# Patient Record
Sex: Female | Born: 1935 | Race: White | Hispanic: No | Smoking: Never smoker
Health system: Southern US, Community
[De-identification: ages and names within clinical notes are randomized; demographics above are authoritative.]

## PROBLEM LIST (undated history)

## (undated) DIAGNOSIS — S82899A Other fracture of unspecified lower leg, initial encounter for closed fracture: Secondary | ICD-10-CM

## (undated) DIAGNOSIS — F0390 Unspecified dementia without behavioral disturbance: Secondary | ICD-10-CM

## (undated) DIAGNOSIS — E7800 Pure hypercholesterolemia, unspecified: Secondary | ICD-10-CM

## (undated) DIAGNOSIS — S72009A Fracture of unspecified part of neck of unspecified femur, initial encounter for closed fracture: Secondary | ICD-10-CM

## (undated) DIAGNOSIS — H3530 Unspecified macular degeneration: Secondary | ICD-10-CM

## (undated) DIAGNOSIS — I10 Essential (primary) hypertension: Secondary | ICD-10-CM

---

## 2004-03-23 ENCOUNTER — Inpatient Hospital Stay (HOSPITAL_COMMUNITY): Admission: AD | Admit: 2004-03-23 | Discharge: 2004-03-28 | Payer: Self-pay | Admitting: Orthopedic Surgery

## 2004-03-23 ENCOUNTER — Ambulatory Visit: Payer: Self-pay | Admitting: Physical Medicine & Rehabilitation

## 2004-03-28 ENCOUNTER — Ambulatory Visit: Payer: Self-pay | Admitting: Physical Medicine & Rehabilitation

## 2004-03-28 ENCOUNTER — Inpatient Hospital Stay
Admission: RE | Admit: 2004-03-28 | Discharge: 2004-04-06 | Payer: Self-pay | Admitting: Physical Medicine & Rehabilitation

## 2007-08-20 ENCOUNTER — Emergency Department (HOSPITAL_COMMUNITY): Admission: EM | Admit: 2007-08-20 | Discharge: 2007-08-20 | Payer: Self-pay | Admitting: Emergency Medicine

## 2007-12-29 ENCOUNTER — Emergency Department (HOSPITAL_COMMUNITY): Admission: EM | Admit: 2007-12-29 | Discharge: 2007-12-29 | Payer: Self-pay | Admitting: Emergency Medicine

## 2010-11-16 NOTE — Discharge Summary (Signed)
Alyssa Alyssa, Alyssa NO.:  [id number]   MEDICAL RECORD NO.:  [id number]          PATIENT TYPE:  ORB   LOCATION:  4533                         FACILITY:  [facility]   PHYSICIAN:  Ellwood Dense, M.D.   DATE OF BIRTH:  [birth year]   DATE OF ADMISSION:  03/28/2004  DATE OF DISCHARGE:  04/06/2004                                 DISCHARGE SUMMARY   DISCHARGE DIAGNOSES:  1.  Left distal tibial fracture with open reduction internal fixation.  2.  Hypertension.  3.  Congested cough secondary to postnasal drip.   HISTORY OF PRESENT ILLNESS:  Alyssa Alyssa is a 77 year old female with  history of hypertension who fell sustaining tibia fracture on September 23.  The patient underwent ORIF by Dr. Carola Jacobson and postop is nonweightbearing.  Subcutaneous Lovenox is being used for DVT prophylaxis.  Cam walker is  currently in place.  Therapies were initiated and the patient has been  progressing along well.  Currently, she is minimal assist for transfer,  minimal assist ambulating 80 feet at nonweightbearing status.   PAST MEDICAL HISTORY:  1.  Hypertension.  2.  Decreased vision in left eye.  3.  Urgency.  4.  Chronic congested cough with postnasal drip for the past year.   ALLERGIES:  AMPICILLIN.   SOCIAL HISTORY:  The patient lives alone in one-level home with three to  four steps to entry.  Was independent prior to admission.  She does not use  any tobacco or alcohol.   HOSPITAL COURSE:  Alyssa Alyssa was admitted to subacute on March 28, 2004, for levels of therapy to consist of OT/PT daily.  Past admission,  she was maintained on subcu Lovenox for DVT prophylaxis.  Secondary to the  patient's chronic cough, she was started on some Robitussin as well as  Zyrtec as an antihistamine with Nasacort to help treat postnasal drip.  Followup labs done on September 29, showed hemoglobin 9.6, hematocrit 27.7,  white count 7.7, platelets 306.  Check of electrolytes showed  sodium 138,  potassium 4.1, chloride 105, CO2 28, BUN 7, creatinine 0.8, glucose 88.  Mild elevation in LFTs noted with SGOT 50, SGPT 44, Alk phos 41.  Blood  pressures were monitored on a b.i.d. basis and showed good control.  The  patient continued with congested cough, especially in the a.m.  Robitussin  was changed to [facility], however, the patient reports minimal  improvement.  Therefore, she was taken off these medications at discharge,  and the patient is to follow up with ENT once mobility improves.  Other work-  up includes check of UA that showed 40,000 colonies of Enterobacter.  The  patient was treated with a 3 day course of Cipro for this.  The patient's  left lower extremity incision has healed well without any signs or symptoms  of infection.  Sutures were discontinued without difficulty.  She continued  with minimal edema of left lower extremity.  She is to continue wearing Cam  walker for now with elevation for edema control.  During the stay in [place],  Alyssa Alyssa progressed along well.  She is currently modified independent  for ADLs, modified independent for transfers, modified independent for  ambulating 100 feet with a rolling walker.  Further followup therapies to  include home health PT/OT by [facility] past discharge.  Lovenox  was discontinued at discharge and the patient to start on enteric coated  aspirin 325 mg one p.o. per day until advance of weightbearing status.   DISCHARGE MEDICATIONS:  1.  Lotensin 20 mg a day.  2.  Ocuvite one per day.  3.  Oxycodone 5-10 mg p.o. q.4-6h. p.r.n. pain.  4.  Ecotrin 325 mg a day.   ACTIVITY:  No weight on left lower extremity.  Use walker.  Wear boot on  left leg at all times.   WOUND CARE:  Wash with soap and water.  Keep clean and dry.   SPECIAL INSTRUCTIONS:  No smoking.   FOLLOW UP:  The patient is to follow up with Dr. Carola Jacobson for appointment in  the next few weeks.  Follow up with M.D. for  referral to ENT physician.  Follow up with Dr. Thomasena Jacobson as needed.       PP/MEDQ  D:  04/06/2004  T:  04/07/2004  Job:  [id number]   cc:   Alyssa Alyssa. Alyssa Alyssa, M.D.  Fax: [phone]

## 2010-11-16 NOTE — Op Note (Signed)
NAMEMARIBELLE, [name]:  [id number]   MEDICAL RECORD NO.:  [id number]          PATIENT TYPE:  OIB   LOCATION:  5018                         FACILITY:  [facility]   PHYSICIAN:  Doralee Albino. Carola Frost, M.D. DATE OF BIRTH:  28-Jun-1923   DATE OF PROCEDURE:  03/23/2004  DATE OF DISCHARGE:                                 OPERATIVE REPORT   PREOPERATIVE DIAGNOSIS:  Left distal tibial fracture, pilon variant.   POSTOPERATIVE DIAGNOSIS:  Left distal tibial fracture, pilon variant.   PROCEDURE:  Open reduction and internal fixation of left distal tibia.   SURGEON:  Doralee Albino. Carola Frost, M.D.   ANESTHESIA:  General.   COMPLICATIONS:  None.   TOURNIQUET:  None.   ESTIMATED BLOOD LOSS:  30 mL.   DISPOSITION:  To PACU.   CONDITION:  Stable.   BRIEF SUMMARY OF INDICATION FOR PROCEDURE:  Alyssa Jacobson is a 77-year-  old white female who sustained a low-energy fracture to her left tibia  yesterday morning.  She was able to have ice and a splint applied  immediately, and was referred to [facility] for further treatment.  She  denied any numbness or tingling in her extremities.  She was seen in the  office of Dr. Jeannett Senior D. Jacobson and referred to me for further management.  After discussion of the risks and benefits of surgery including the  possibility of wound dehiscence, infection with a possible need for  amputation should one of these complications develop, as well as nonunion,  malunion and need for further surgery, the patient and her family did elect  to proceed.   BRIEF DESCRIPTION OF PROCEDURE:  Alyssa Jacobson was brought to the operating  room where general anesthesia was induced.  Her left lower extremity was  prepped and draped in the usual sterile fashion.  She did receive vancomycin  for preoperative antibiotics.  A thigh tourniquet was placed but was not  used or inflated during the case.  The C-arm was then brought in and the  fracture site identified  radiographically.  A small stab incision was then  made over the distal fibular shaft as well as over the distal spike of the  proximal tibial segment.  The large tenaculum was used to gently tease the  fracture into a reduced position.  A small 3-cm incision was then made over  the anterolateral aspect of the distal tibia; once through the skin,  scissors and pickups were used to dissect the saphenous vein free and it was  retracted throughout the case to protect it from injury.  A Cobb was then  passed over the periosteum and under the subcutaneous tissues along the  medial aspect of the tibial shaft up to the tenaculum clamp.  The reduction  was then held manually while the clamp was relaxed and the Cobb allowed to  pass further up the shaft.  The tenaculum was then engaged again and  appropriate size DePuy 3.5 plate inserted percutaneously and slid along the  medial tibia.  A medial-to-lateral lag screw was then placed through the  plate, first over-drilling with a 3.5 drill and  then capturing the lateral  cortex of the distal fragment.  An additional anterior-to-posterior lag  screw was then placed using a similar technique through a small stab  incision over the direct anterior cortex of the tibia.  Once these 2 screws  were in place, anterior-posterior position of the plate was fine-tuned on  the lateral fluoroscopic image of the tibial shaft.  Multiple screws were  then passed through the tibial plate.  The 2 most proximal screws were  passed through a 1-cm incision and the 1 screw in the shaft was also placed  through a stab incision, but did appear to break off a small fragment of  cortex on the opposite side during passage.  It nonetheless achieved a good  fixation during insertion.  Additional screws were also placed in the  proximal fragment and then attention was turned distally where the tenaculum  was adjusted to improve the reduction in the AP plane.  Once this was   accomplished, additional screws were placed into the plate distally; this  achieved and held a near-anatomic reduction in the anterior-posterior plane  as well as the lateral plane.  Final x-rays were obtained, demonstrating  that all hardware was appropriately placed and none of it intra-articular.  Although the patient did have osteopenic bone, we were able to achieve a  good reduction.  The incision was extended proximally to facilitate passage  of the screws without creating undue tension on the skin.  The wounds were  then copiously irrigated and closed in standard layered fashion with 2-0  Vicryl for the subcu layer and simple 3-0 nylon sutures for the skin.  There  did not appear to be any areas of threatened skin at the closure.  Adaptic  and a sterile gently compressive dressing were applied and then a posterior  and U splint.  The patient's foot was kept in the plantigrade position until  the splint hardened and then she was awakened from anesthesia and  transported to the PACU in stable condition.   PROGNOSIS:  Alyssa Jacobson had sustained a fracture associated with multiple  complications and risks.  She remains at elevated risk for delayed union,  nonunion and malunion because of her age, osteopenia and location of the  fracture.  These factors are slightly decreased because of the ability to  reduce this fracture without formally opening the fracture site and applying  a plate percutaneously, however, they certainly remain serious concerns.  She will be immobilized in the splint for the next 10 days, until her soft  tissues have had an opportunity to recover from her injury and the trauma of  surgery.  Should there be any question at the followup appointment for  suture removal, we anticipate continuing with her immobilization for an  additional 2 weeks.  At the time her soft tissues become stable, we will initiate range of motion but delay weightbearing until approximately  6-8  weeks, at which time she will begin a graduated increase in her  weightbearing.  She also remains at risk for thromboembolic complications  and will be placed on DVT prophylaxis with Lovenox initially and then  changed to aspirin.       MHH/MEDQ  D:  03/23/2004  T:  03/25/2004  Job:  [id number]

## 2010-11-16 NOTE — Discharge Summary (Signed)
Alyssa [name]:  [id number]   MEDICAL RECORD NO.:  [id number]          PATIENT TYPE:  OIB   LOCATION:  5018                         FACILITY:  [facility]   PHYSICIAN:  Doralee Albino. Carola Frost, M.D. DATE OF BIRTH:  1922/12/24   DATE OF ADMISSION:  03/23/2004  DATE OF DISCHARGE:  03/27/2004                                 DISCHARGE SUMMARY   ADMISSION DIAGNOSIS:  Left distal tibial fracture.   DISCHARGE DIAGNOSIS:  Status post ORIF of left distal tibial fracture.   SERVICES:  Dr. Myrene Galas is the attending.   PROCEDURE PERFORMED:  On March 23, 2004, open reduction, internal  fixation of left tibia. Surgeon was Dr. Carola Frost.   HISTORY:  Alyssa Jacobson is a 77 year old white female who sustained a low  anterior fracture to his left tibia. She was able to have ice and splint  applied immediately and was referred to [facility] for further treatment. On  physical exam, she denied any numbness or tingling in her extremities. She  was seen in the office of Dr. Georgena Spurling and was referred to Dr. Carola Frost  for further management. During her course here, on postoperative day #1 the  patient was resting very comfortably. She stated that she only occasionally  had pain in her ankle. Other than that no complaints. Her foot was in a  splint at this time.  Postoperative day one was rather benign. On  postoperative day #2, March 25, 2004, complained of congestion in her  chest. She did not have a fever at this time. She did report however that  she did have some thick productive cough. Her pain in her ankle is well  controlled at this time. It was noted that her lungs did have slight  expiratory wheezing, left greater than right side. Her ankle wounds looked  good.  No erythema noted and distal neurovascular exam was intact. It is  generally that the patient was improving from surgery. The patient was given  a breathing treatment with albuterol nebulizer and an  orthopedic tech re-  applied a new splint. On postoperative day #3, the patient was doing quite  well, states that her pain was well controlled. Her only complaint at this  time was that she was urinating frequently. She reports that her congestion  in her chest was improving at this time as well. It was noted that the  patient was resting comfortably.  Her distal neurovascular exam was intact.  No change from exam on previous days. The patient was put in a Cam boot  walker to her left ankle and rehab was consulted again for possible  placement. On postoperative day #4, which is today, the patient is seen. No  change in exam from previous day .the patient reports that she is doing  quite well. No concerns or complaints at this time. She has been approved  for placement in rehab and as soon as a bed becomes available she will be  transferred to there. She may shower in approximately three days and boot is  to be in place up until that point and then put  back on after that.   DISCHARGE CONDITION:  Good.   DISPOSITION:  Discharged to rehabilitation.   DISCHARGE MEDICATIONS:  1.  Colace 100 mg b.i.d.  2.  Lovenox 40 mg subcu daily.  3.  Lotensin 20 mg p.o. daily.  4.  Albuterol inhaler on a p.r.n. basis.  5.  Vicodin 5/500 p.r.n. for six hours as needed for pain, one to two      tablets.  6.  Tylenol 325 mg p.o. as needed for fever.   INSTRUCTIONS:  Diet as tolerated. Dressing changes as needed. The patient  must keep boot in place, but may shower in approximately three more days.  The patient is to follow up with Dr. Myrene Galas in approximately ten  days, [phone].       RWC/MEDQ  D:  03/27/2004  T:  03/27/2004  Job:  [id number]

## 2011-02-01 ENCOUNTER — Encounter (INDEPENDENT_AMBULATORY_CARE_PROVIDER_SITE_OTHER): Payer: Medicare Other | Admitting: Ophthalmology

## 2011-02-01 DIAGNOSIS — H3530 Unspecified macular degeneration: Secondary | ICD-10-CM

## 2011-02-01 DIAGNOSIS — H43819 Vitreous degeneration, unspecified eye: Secondary | ICD-10-CM

## 2011-02-01 DIAGNOSIS — H353290 Exudative age-related macular degeneration, unspecified eye, stage unspecified: Secondary | ICD-10-CM

## 2011-03-05 ENCOUNTER — Encounter (INDEPENDENT_AMBULATORY_CARE_PROVIDER_SITE_OTHER): Payer: Medicare Other | Admitting: Ophthalmology

## 2011-03-05 DIAGNOSIS — H3530 Unspecified macular degeneration: Secondary | ICD-10-CM

## 2011-03-05 DIAGNOSIS — H43819 Vitreous degeneration, unspecified eye: Secondary | ICD-10-CM

## 2011-03-05 DIAGNOSIS — H353290 Exudative age-related macular degeneration, unspecified eye, stage unspecified: Secondary | ICD-10-CM

## 2011-03-12 ENCOUNTER — Encounter (INDEPENDENT_AMBULATORY_CARE_PROVIDER_SITE_OTHER): Payer: Medicare Other | Admitting: Ophthalmology

## 2011-03-12 DIAGNOSIS — H43819 Vitreous degeneration, unspecified eye: Secondary | ICD-10-CM

## 2011-03-12 DIAGNOSIS — H3530 Unspecified macular degeneration: Secondary | ICD-10-CM

## 2011-03-12 DIAGNOSIS — H353290 Exudative age-related macular degeneration, unspecified eye, stage unspecified: Secondary | ICD-10-CM

## 2011-04-09 ENCOUNTER — Encounter (INDEPENDENT_AMBULATORY_CARE_PROVIDER_SITE_OTHER): Payer: Medicare Other | Admitting: Ophthalmology

## 2011-04-16 ENCOUNTER — Encounter (INDEPENDENT_AMBULATORY_CARE_PROVIDER_SITE_OTHER): Payer: Medicare Other | Admitting: Ophthalmology

## 2011-04-16 DIAGNOSIS — H43819 Vitreous degeneration, unspecified eye: Secondary | ICD-10-CM

## 2011-04-16 DIAGNOSIS — H3530 Unspecified macular degeneration: Secondary | ICD-10-CM

## 2011-04-16 DIAGNOSIS — H353290 Exudative age-related macular degeneration, unspecified eye, stage unspecified: Secondary | ICD-10-CM

## 2011-04-23 ENCOUNTER — Encounter (INDEPENDENT_AMBULATORY_CARE_PROVIDER_SITE_OTHER): Payer: Medicare Other | Admitting: Ophthalmology

## 2011-04-23 DIAGNOSIS — H3530 Unspecified macular degeneration: Secondary | ICD-10-CM

## 2011-04-23 DIAGNOSIS — H43819 Vitreous degeneration, unspecified eye: Secondary | ICD-10-CM

## 2011-04-23 DIAGNOSIS — H353290 Exudative age-related macular degeneration, unspecified eye, stage unspecified: Secondary | ICD-10-CM

## 2011-05-28 ENCOUNTER — Encounter (INDEPENDENT_AMBULATORY_CARE_PROVIDER_SITE_OTHER): Payer: Medicare Other | Admitting: Ophthalmology

## 2011-05-28 DIAGNOSIS — H353290 Exudative age-related macular degeneration, unspecified eye, stage unspecified: Secondary | ICD-10-CM

## 2011-05-28 DIAGNOSIS — H43819 Vitreous degeneration, unspecified eye: Secondary | ICD-10-CM

## 2011-05-28 DIAGNOSIS — H3530 Unspecified macular degeneration: Secondary | ICD-10-CM

## 2011-06-04 ENCOUNTER — Encounter (INDEPENDENT_AMBULATORY_CARE_PROVIDER_SITE_OTHER): Payer: Medicare Other | Admitting: Ophthalmology

## 2011-06-04 DIAGNOSIS — H353290 Exudative age-related macular degeneration, unspecified eye, stage unspecified: Secondary | ICD-10-CM

## 2011-06-04 DIAGNOSIS — H43819 Vitreous degeneration, unspecified eye: Secondary | ICD-10-CM

## 2011-06-04 DIAGNOSIS — H3530 Unspecified macular degeneration: Secondary | ICD-10-CM

## 2011-07-09 ENCOUNTER — Encounter (INDEPENDENT_AMBULATORY_CARE_PROVIDER_SITE_OTHER): Payer: Medicare Other | Admitting: Ophthalmology

## 2011-07-09 DIAGNOSIS — H43819 Vitreous degeneration, unspecified eye: Secondary | ICD-10-CM

## 2011-07-09 DIAGNOSIS — H353290 Exudative age-related macular degeneration, unspecified eye, stage unspecified: Secondary | ICD-10-CM

## 2011-07-09 DIAGNOSIS — H3530 Unspecified macular degeneration: Secondary | ICD-10-CM

## 2011-07-16 ENCOUNTER — Encounter (INDEPENDENT_AMBULATORY_CARE_PROVIDER_SITE_OTHER): Payer: Medicare Other | Admitting: Ophthalmology

## 2011-07-16 DIAGNOSIS — H3530 Unspecified macular degeneration: Secondary | ICD-10-CM

## 2011-07-16 DIAGNOSIS — H353290 Exudative age-related macular degeneration, unspecified eye, stage unspecified: Secondary | ICD-10-CM

## 2011-07-16 DIAGNOSIS — H43819 Vitreous degeneration, unspecified eye: Secondary | ICD-10-CM

## 2011-08-13 ENCOUNTER — Encounter (INDEPENDENT_AMBULATORY_CARE_PROVIDER_SITE_OTHER): Payer: Medicare Other | Admitting: Ophthalmology

## 2011-08-13 DIAGNOSIS — H353290 Exudative age-related macular degeneration, unspecified eye, stage unspecified: Secondary | ICD-10-CM

## 2011-08-13 DIAGNOSIS — H43819 Vitreous degeneration, unspecified eye: Secondary | ICD-10-CM

## 2011-08-13 DIAGNOSIS — H3530 Unspecified macular degeneration: Secondary | ICD-10-CM

## 2011-08-27 ENCOUNTER — Encounter (INDEPENDENT_AMBULATORY_CARE_PROVIDER_SITE_OTHER): Payer: Medicare Other | Admitting: Ophthalmology

## 2011-08-27 DIAGNOSIS — H353290 Exudative age-related macular degeneration, unspecified eye, stage unspecified: Secondary | ICD-10-CM

## 2011-08-27 DIAGNOSIS — H43819 Vitreous degeneration, unspecified eye: Secondary | ICD-10-CM

## 2011-08-27 DIAGNOSIS — H3530 Unspecified macular degeneration: Secondary | ICD-10-CM

## 2011-09-10 ENCOUNTER — Encounter (INDEPENDENT_AMBULATORY_CARE_PROVIDER_SITE_OTHER): Payer: Medicare Other | Admitting: Ophthalmology

## 2011-09-10 DIAGNOSIS — H3530 Unspecified macular degeneration: Secondary | ICD-10-CM

## 2011-09-10 DIAGNOSIS — H353290 Exudative age-related macular degeneration, unspecified eye, stage unspecified: Secondary | ICD-10-CM

## 2011-09-10 DIAGNOSIS — H43819 Vitreous degeneration, unspecified eye: Secondary | ICD-10-CM

## 2011-10-08 ENCOUNTER — Encounter (INDEPENDENT_AMBULATORY_CARE_PROVIDER_SITE_OTHER): Payer: Medicare Other | Admitting: Ophthalmology

## 2011-10-08 DIAGNOSIS — H43819 Vitreous degeneration, unspecified eye: Secondary | ICD-10-CM

## 2011-10-08 DIAGNOSIS — H353290 Exudative age-related macular degeneration, unspecified eye, stage unspecified: Secondary | ICD-10-CM

## 2011-10-08 DIAGNOSIS — H3530 Unspecified macular degeneration: Secondary | ICD-10-CM

## 2011-10-14 ENCOUNTER — Encounter (INDEPENDENT_AMBULATORY_CARE_PROVIDER_SITE_OTHER): Payer: Medicare Other | Admitting: Ophthalmology

## 2011-10-14 DIAGNOSIS — H353290 Exudative age-related macular degeneration, unspecified eye, stage unspecified: Secondary | ICD-10-CM

## 2011-10-14 DIAGNOSIS — H3530 Unspecified macular degeneration: Secondary | ICD-10-CM

## 2011-10-14 DIAGNOSIS — H43819 Vitreous degeneration, unspecified eye: Secondary | ICD-10-CM

## 2011-11-12 ENCOUNTER — Encounter (INDEPENDENT_AMBULATORY_CARE_PROVIDER_SITE_OTHER): Payer: Medicare Other | Admitting: Ophthalmology

## 2011-11-12 DIAGNOSIS — H353290 Exudative age-related macular degeneration, unspecified eye, stage unspecified: Secondary | ICD-10-CM

## 2011-11-12 DIAGNOSIS — H43819 Vitreous degeneration, unspecified eye: Secondary | ICD-10-CM

## 2011-11-12 DIAGNOSIS — H3530 Unspecified macular degeneration: Secondary | ICD-10-CM

## 2011-11-19 ENCOUNTER — Encounter (INDEPENDENT_AMBULATORY_CARE_PROVIDER_SITE_OTHER): Payer: Medicare Other | Admitting: Ophthalmology

## 2011-11-19 DIAGNOSIS — H43819 Vitreous degeneration, unspecified eye: Secondary | ICD-10-CM

## 2011-11-19 DIAGNOSIS — H353290 Exudative age-related macular degeneration, unspecified eye, stage unspecified: Secondary | ICD-10-CM

## 2011-11-19 DIAGNOSIS — H3530 Unspecified macular degeneration: Secondary | ICD-10-CM

## 2011-12-10 ENCOUNTER — Encounter (INDEPENDENT_AMBULATORY_CARE_PROVIDER_SITE_OTHER): Payer: Medicare Other | Admitting: Ophthalmology

## 2011-12-10 DIAGNOSIS — H353290 Exudative age-related macular degeneration, unspecified eye, stage unspecified: Secondary | ICD-10-CM

## 2011-12-10 DIAGNOSIS — I10 Essential (primary) hypertension: Secondary | ICD-10-CM

## 2011-12-10 DIAGNOSIS — H43819 Vitreous degeneration, unspecified eye: Secondary | ICD-10-CM

## 2011-12-10 DIAGNOSIS — H3530 Unspecified macular degeneration: Secondary | ICD-10-CM

## 2011-12-10 DIAGNOSIS — H35039 Hypertensive retinopathy, unspecified eye: Secondary | ICD-10-CM

## 2011-12-31 ENCOUNTER — Encounter (INDEPENDENT_AMBULATORY_CARE_PROVIDER_SITE_OTHER): Payer: Medicare Other | Admitting: Ophthalmology

## 2011-12-31 DIAGNOSIS — H43399 Other vitreous opacities, unspecified eye: Secondary | ICD-10-CM

## 2011-12-31 DIAGNOSIS — H353290 Exudative age-related macular degeneration, unspecified eye, stage unspecified: Secondary | ICD-10-CM

## 2011-12-31 DIAGNOSIS — H35039 Hypertensive retinopathy, unspecified eye: Secondary | ICD-10-CM

## 2011-12-31 DIAGNOSIS — I10 Essential (primary) hypertension: Secondary | ICD-10-CM

## 2012-01-07 ENCOUNTER — Encounter (INDEPENDENT_AMBULATORY_CARE_PROVIDER_SITE_OTHER): Payer: Medicare Other | Admitting: Ophthalmology

## 2012-01-07 DIAGNOSIS — H35039 Hypertensive retinopathy, unspecified eye: Secondary | ICD-10-CM

## 2012-01-07 DIAGNOSIS — H43399 Other vitreous opacities, unspecified eye: Secondary | ICD-10-CM

## 2012-01-07 DIAGNOSIS — H353290 Exudative age-related macular degeneration, unspecified eye, stage unspecified: Secondary | ICD-10-CM

## 2012-01-31 ENCOUNTER — Encounter (INDEPENDENT_AMBULATORY_CARE_PROVIDER_SITE_OTHER): Payer: Medicare Other | Admitting: Ophthalmology

## 2012-01-31 DIAGNOSIS — I10 Essential (primary) hypertension: Secondary | ICD-10-CM

## 2012-01-31 DIAGNOSIS — H43819 Vitreous degeneration, unspecified eye: Secondary | ICD-10-CM

## 2012-01-31 DIAGNOSIS — H35039 Hypertensive retinopathy, unspecified eye: Secondary | ICD-10-CM

## 2012-01-31 DIAGNOSIS — H3530 Unspecified macular degeneration: Secondary | ICD-10-CM

## 2012-01-31 DIAGNOSIS — H353290 Exudative age-related macular degeneration, unspecified eye, stage unspecified: Secondary | ICD-10-CM

## 2012-02-11 ENCOUNTER — Encounter (INDEPENDENT_AMBULATORY_CARE_PROVIDER_SITE_OTHER): Payer: Medicare Other | Admitting: Ophthalmology

## 2012-02-11 DIAGNOSIS — H35039 Hypertensive retinopathy, unspecified eye: Secondary | ICD-10-CM

## 2012-02-11 DIAGNOSIS — H43819 Vitreous degeneration, unspecified eye: Secondary | ICD-10-CM

## 2012-02-11 DIAGNOSIS — I10 Essential (primary) hypertension: Secondary | ICD-10-CM

## 2012-02-11 DIAGNOSIS — H353290 Exudative age-related macular degeneration, unspecified eye, stage unspecified: Secondary | ICD-10-CM

## 2012-03-03 ENCOUNTER — Encounter (INDEPENDENT_AMBULATORY_CARE_PROVIDER_SITE_OTHER): Payer: Medicare Other | Admitting: Ophthalmology

## 2012-03-03 DIAGNOSIS — H353290 Exudative age-related macular degeneration, unspecified eye, stage unspecified: Secondary | ICD-10-CM

## 2012-03-03 DIAGNOSIS — I10 Essential (primary) hypertension: Secondary | ICD-10-CM

## 2012-03-03 DIAGNOSIS — H35039 Hypertensive retinopathy, unspecified eye: Secondary | ICD-10-CM

## 2012-03-03 DIAGNOSIS — H43819 Vitreous degeneration, unspecified eye: Secondary | ICD-10-CM

## 2012-03-24 ENCOUNTER — Encounter (INDEPENDENT_AMBULATORY_CARE_PROVIDER_SITE_OTHER): Payer: Medicare Other | Admitting: Ophthalmology

## 2012-03-24 DIAGNOSIS — I10 Essential (primary) hypertension: Secondary | ICD-10-CM

## 2012-03-24 DIAGNOSIS — H353290 Exudative age-related macular degeneration, unspecified eye, stage unspecified: Secondary | ICD-10-CM

## 2012-03-24 DIAGNOSIS — H43819 Vitreous degeneration, unspecified eye: Secondary | ICD-10-CM

## 2012-03-24 DIAGNOSIS — H35039 Hypertensive retinopathy, unspecified eye: Secondary | ICD-10-CM

## 2012-03-31 ENCOUNTER — Encounter (INDEPENDENT_AMBULATORY_CARE_PROVIDER_SITE_OTHER): Payer: Medicare Other | Admitting: Ophthalmology

## 2012-03-31 DIAGNOSIS — I10 Essential (primary) hypertension: Secondary | ICD-10-CM

## 2012-03-31 DIAGNOSIS — H35039 Hypertensive retinopathy, unspecified eye: Secondary | ICD-10-CM

## 2012-03-31 DIAGNOSIS — H353290 Exudative age-related macular degeneration, unspecified eye, stage unspecified: Secondary | ICD-10-CM

## 2012-03-31 DIAGNOSIS — H43819 Vitreous degeneration, unspecified eye: Secondary | ICD-10-CM

## 2012-04-28 ENCOUNTER — Encounter (INDEPENDENT_AMBULATORY_CARE_PROVIDER_SITE_OTHER): Payer: Medicare Other | Admitting: Ophthalmology

## 2012-04-28 DIAGNOSIS — H43819 Vitreous degeneration, unspecified eye: Secondary | ICD-10-CM

## 2012-04-28 DIAGNOSIS — I10 Essential (primary) hypertension: Secondary | ICD-10-CM

## 2012-04-28 DIAGNOSIS — H353290 Exudative age-related macular degeneration, unspecified eye, stage unspecified: Secondary | ICD-10-CM

## 2012-04-28 DIAGNOSIS — H35039 Hypertensive retinopathy, unspecified eye: Secondary | ICD-10-CM

## 2012-05-05 ENCOUNTER — Encounter (INDEPENDENT_AMBULATORY_CARE_PROVIDER_SITE_OTHER): Payer: Medicare Other | Admitting: Ophthalmology

## 2012-05-05 DIAGNOSIS — H43819 Vitreous degeneration, unspecified eye: Secondary | ICD-10-CM

## 2012-05-05 DIAGNOSIS — H35039 Hypertensive retinopathy, unspecified eye: Secondary | ICD-10-CM

## 2012-05-05 DIAGNOSIS — I10 Essential (primary) hypertension: Secondary | ICD-10-CM

## 2012-05-05 DIAGNOSIS — H353290 Exudative age-related macular degeneration, unspecified eye, stage unspecified: Secondary | ICD-10-CM

## 2012-05-22 ENCOUNTER — Encounter (INDEPENDENT_AMBULATORY_CARE_PROVIDER_SITE_OTHER): Payer: Medicare Other | Admitting: Ophthalmology

## 2012-05-25 ENCOUNTER — Encounter (INDEPENDENT_AMBULATORY_CARE_PROVIDER_SITE_OTHER): Payer: Medicare Other | Admitting: Ophthalmology

## 2012-05-25 DIAGNOSIS — H35039 Hypertensive retinopathy, unspecified eye: Secondary | ICD-10-CM

## 2012-05-25 DIAGNOSIS — I10 Essential (primary) hypertension: Secondary | ICD-10-CM

## 2012-05-25 DIAGNOSIS — H353290 Exudative age-related macular degeneration, unspecified eye, stage unspecified: Secondary | ICD-10-CM

## 2012-05-25 DIAGNOSIS — H43819 Vitreous degeneration, unspecified eye: Secondary | ICD-10-CM

## 2012-06-16 ENCOUNTER — Encounter (INDEPENDENT_AMBULATORY_CARE_PROVIDER_SITE_OTHER): Payer: Medicare Other | Admitting: Ophthalmology

## 2012-06-16 DIAGNOSIS — H35039 Hypertensive retinopathy, unspecified eye: Secondary | ICD-10-CM

## 2012-06-16 DIAGNOSIS — H353290 Exudative age-related macular degeneration, unspecified eye, stage unspecified: Secondary | ICD-10-CM

## 2012-06-16 DIAGNOSIS — I10 Essential (primary) hypertension: Secondary | ICD-10-CM

## 2012-06-16 DIAGNOSIS — H43819 Vitreous degeneration, unspecified eye: Secondary | ICD-10-CM

## 2012-06-30 ENCOUNTER — Encounter (INDEPENDENT_AMBULATORY_CARE_PROVIDER_SITE_OTHER): Payer: Medicare Other | Admitting: Ophthalmology

## 2012-06-30 DIAGNOSIS — H35039 Hypertensive retinopathy, unspecified eye: Secondary | ICD-10-CM

## 2012-06-30 DIAGNOSIS — H353290 Exudative age-related macular degeneration, unspecified eye, stage unspecified: Secondary | ICD-10-CM

## 2012-06-30 DIAGNOSIS — I10 Essential (primary) hypertension: Secondary | ICD-10-CM

## 2012-06-30 DIAGNOSIS — H43819 Vitreous degeneration, unspecified eye: Secondary | ICD-10-CM

## 2012-07-24 ENCOUNTER — Encounter (INDEPENDENT_AMBULATORY_CARE_PROVIDER_SITE_OTHER): Payer: Medicare Other | Admitting: Ophthalmology

## 2012-07-24 DIAGNOSIS — H43819 Vitreous degeneration, unspecified eye: Secondary | ICD-10-CM

## 2012-07-24 DIAGNOSIS — H35039 Hypertensive retinopathy, unspecified eye: Secondary | ICD-10-CM

## 2012-07-24 DIAGNOSIS — I10 Essential (primary) hypertension: Secondary | ICD-10-CM

## 2012-07-24 DIAGNOSIS — H353290 Exudative age-related macular degeneration, unspecified eye, stage unspecified: Secondary | ICD-10-CM

## 2012-07-28 ENCOUNTER — Encounter (INDEPENDENT_AMBULATORY_CARE_PROVIDER_SITE_OTHER): Payer: Medicare Other | Admitting: Ophthalmology

## 2012-07-28 DIAGNOSIS — H353290 Exudative age-related macular degeneration, unspecified eye, stage unspecified: Secondary | ICD-10-CM

## 2012-07-28 DIAGNOSIS — I10 Essential (primary) hypertension: Secondary | ICD-10-CM

## 2012-07-28 DIAGNOSIS — H43819 Vitreous degeneration, unspecified eye: Secondary | ICD-10-CM

## 2012-07-28 DIAGNOSIS — H35039 Hypertensive retinopathy, unspecified eye: Secondary | ICD-10-CM

## 2012-08-18 ENCOUNTER — Encounter (INDEPENDENT_AMBULATORY_CARE_PROVIDER_SITE_OTHER): Payer: Medicare Other | Admitting: Ophthalmology

## 2012-08-18 DIAGNOSIS — H43819 Vitreous degeneration, unspecified eye: Secondary | ICD-10-CM

## 2012-08-18 DIAGNOSIS — H353290 Exudative age-related macular degeneration, unspecified eye, stage unspecified: Secondary | ICD-10-CM

## 2012-08-18 DIAGNOSIS — I10 Essential (primary) hypertension: Secondary | ICD-10-CM

## 2012-08-18 DIAGNOSIS — H35039 Hypertensive retinopathy, unspecified eye: Secondary | ICD-10-CM

## 2012-09-08 ENCOUNTER — Encounter (INDEPENDENT_AMBULATORY_CARE_PROVIDER_SITE_OTHER): Payer: Medicare Other | Admitting: Ophthalmology

## 2012-09-08 DIAGNOSIS — H35039 Hypertensive retinopathy, unspecified eye: Secondary | ICD-10-CM

## 2012-09-08 DIAGNOSIS — I10 Essential (primary) hypertension: Secondary | ICD-10-CM

## 2012-09-08 DIAGNOSIS — H353290 Exudative age-related macular degeneration, unspecified eye, stage unspecified: Secondary | ICD-10-CM

## 2012-09-08 DIAGNOSIS — H43819 Vitreous degeneration, unspecified eye: Secondary | ICD-10-CM

## 2012-10-06 ENCOUNTER — Encounter (INDEPENDENT_AMBULATORY_CARE_PROVIDER_SITE_OTHER): Payer: Medicare Other | Admitting: Ophthalmology

## 2012-10-06 DIAGNOSIS — H353290 Exudative age-related macular degeneration, unspecified eye, stage unspecified: Secondary | ICD-10-CM

## 2012-10-06 DIAGNOSIS — H43819 Vitreous degeneration, unspecified eye: Secondary | ICD-10-CM

## 2012-10-06 DIAGNOSIS — H35039 Hypertensive retinopathy, unspecified eye: Secondary | ICD-10-CM

## 2012-10-06 DIAGNOSIS — I10 Essential (primary) hypertension: Secondary | ICD-10-CM

## 2012-11-03 ENCOUNTER — Encounter (INDEPENDENT_AMBULATORY_CARE_PROVIDER_SITE_OTHER): Payer: Medicare Other | Admitting: Ophthalmology

## 2012-11-03 DIAGNOSIS — H43819 Vitreous degeneration, unspecified eye: Secondary | ICD-10-CM

## 2012-11-03 DIAGNOSIS — I10 Essential (primary) hypertension: Secondary | ICD-10-CM

## 2012-11-03 DIAGNOSIS — H35039 Hypertensive retinopathy, unspecified eye: Secondary | ICD-10-CM

## 2012-11-03 DIAGNOSIS — H353290 Exudative age-related macular degeneration, unspecified eye, stage unspecified: Secondary | ICD-10-CM

## 2012-12-03 ENCOUNTER — Encounter (INDEPENDENT_AMBULATORY_CARE_PROVIDER_SITE_OTHER): Payer: Medicare Other | Admitting: Ophthalmology

## 2012-12-03 DIAGNOSIS — I10 Essential (primary) hypertension: Secondary | ICD-10-CM

## 2012-12-03 DIAGNOSIS — H43819 Vitreous degeneration, unspecified eye: Secondary | ICD-10-CM

## 2012-12-03 DIAGNOSIS — H35039 Hypertensive retinopathy, unspecified eye: Secondary | ICD-10-CM

## 2012-12-03 DIAGNOSIS — H353290 Exudative age-related macular degeneration, unspecified eye, stage unspecified: Secondary | ICD-10-CM

## 2012-12-29 ENCOUNTER — Encounter (INDEPENDENT_AMBULATORY_CARE_PROVIDER_SITE_OTHER): Payer: Medicare Other | Admitting: Ophthalmology

## 2012-12-29 DIAGNOSIS — I10 Essential (primary) hypertension: Secondary | ICD-10-CM

## 2012-12-29 DIAGNOSIS — H43819 Vitreous degeneration, unspecified eye: Secondary | ICD-10-CM

## 2012-12-29 DIAGNOSIS — H353290 Exudative age-related macular degeneration, unspecified eye, stage unspecified: Secondary | ICD-10-CM

## 2012-12-29 DIAGNOSIS — H35039 Hypertensive retinopathy, unspecified eye: Secondary | ICD-10-CM

## 2013-02-03 ENCOUNTER — Encounter (INDEPENDENT_AMBULATORY_CARE_PROVIDER_SITE_OTHER): Payer: Medicare Other | Admitting: Ophthalmology

## 2013-02-03 DIAGNOSIS — I10 Essential (primary) hypertension: Secondary | ICD-10-CM

## 2013-02-03 DIAGNOSIS — H43819 Vitreous degeneration, unspecified eye: Secondary | ICD-10-CM

## 2013-02-03 DIAGNOSIS — H35039 Hypertensive retinopathy, unspecified eye: Secondary | ICD-10-CM

## 2013-02-03 DIAGNOSIS — H353290 Exudative age-related macular degeneration, unspecified eye, stage unspecified: Secondary | ICD-10-CM

## 2013-03-03 ENCOUNTER — Encounter (INDEPENDENT_AMBULATORY_CARE_PROVIDER_SITE_OTHER): Payer: Medicare Other | Admitting: Ophthalmology

## 2013-03-03 DIAGNOSIS — H35039 Hypertensive retinopathy, unspecified eye: Secondary | ICD-10-CM

## 2013-03-03 DIAGNOSIS — I10 Essential (primary) hypertension: Secondary | ICD-10-CM

## 2013-03-03 DIAGNOSIS — H43819 Vitreous degeneration, unspecified eye: Secondary | ICD-10-CM

## 2013-03-03 DIAGNOSIS — H353290 Exudative age-related macular degeneration, unspecified eye, stage unspecified: Secondary | ICD-10-CM

## 2013-03-30 ENCOUNTER — Encounter (INDEPENDENT_AMBULATORY_CARE_PROVIDER_SITE_OTHER): Payer: Medicare Other | Admitting: Ophthalmology

## 2013-03-30 DIAGNOSIS — I10 Essential (primary) hypertension: Secondary | ICD-10-CM

## 2013-03-30 DIAGNOSIS — H353290 Exudative age-related macular degeneration, unspecified eye, stage unspecified: Secondary | ICD-10-CM

## 2013-03-30 DIAGNOSIS — H35039 Hypertensive retinopathy, unspecified eye: Secondary | ICD-10-CM

## 2013-03-30 DIAGNOSIS — H43819 Vitreous degeneration, unspecified eye: Secondary | ICD-10-CM

## 2013-04-27 ENCOUNTER — Encounter (INDEPENDENT_AMBULATORY_CARE_PROVIDER_SITE_OTHER): Payer: Medicare Other | Admitting: Ophthalmology

## 2013-04-27 DIAGNOSIS — H35039 Hypertensive retinopathy, unspecified eye: Secondary | ICD-10-CM

## 2013-04-27 DIAGNOSIS — H353290 Exudative age-related macular degeneration, unspecified eye, stage unspecified: Secondary | ICD-10-CM

## 2013-04-27 DIAGNOSIS — H43819 Vitreous degeneration, unspecified eye: Secondary | ICD-10-CM

## 2013-04-27 DIAGNOSIS — I10 Essential (primary) hypertension: Secondary | ICD-10-CM

## 2013-05-25 ENCOUNTER — Encounter (INDEPENDENT_AMBULATORY_CARE_PROVIDER_SITE_OTHER): Payer: Medicare Other | Admitting: Ophthalmology

## 2013-05-25 DIAGNOSIS — H43819 Vitreous degeneration, unspecified eye: Secondary | ICD-10-CM

## 2013-05-25 DIAGNOSIS — I10 Essential (primary) hypertension: Secondary | ICD-10-CM

## 2013-05-25 DIAGNOSIS — H35039 Hypertensive retinopathy, unspecified eye: Secondary | ICD-10-CM

## 2013-05-25 DIAGNOSIS — H353290 Exudative age-related macular degeneration, unspecified eye, stage unspecified: Secondary | ICD-10-CM

## 2013-06-22 ENCOUNTER — Encounter (INDEPENDENT_AMBULATORY_CARE_PROVIDER_SITE_OTHER): Payer: Medicare Other | Admitting: Ophthalmology

## 2013-06-22 DIAGNOSIS — I10 Essential (primary) hypertension: Secondary | ICD-10-CM

## 2013-06-22 DIAGNOSIS — H35039 Hypertensive retinopathy, unspecified eye: Secondary | ICD-10-CM

## 2013-06-22 DIAGNOSIS — H43819 Vitreous degeneration, unspecified eye: Secondary | ICD-10-CM

## 2013-06-22 DIAGNOSIS — H353290 Exudative age-related macular degeneration, unspecified eye, stage unspecified: Secondary | ICD-10-CM

## 2013-07-20 ENCOUNTER — Encounter (INDEPENDENT_AMBULATORY_CARE_PROVIDER_SITE_OTHER): Payer: Medicare Other | Admitting: Ophthalmology

## 2013-07-20 DIAGNOSIS — I10 Essential (primary) hypertension: Secondary | ICD-10-CM

## 2013-07-20 DIAGNOSIS — H353290 Exudative age-related macular degeneration, unspecified eye, stage unspecified: Secondary | ICD-10-CM

## 2013-07-20 DIAGNOSIS — H43819 Vitreous degeneration, unspecified eye: Secondary | ICD-10-CM

## 2013-07-20 DIAGNOSIS — H35039 Hypertensive retinopathy, unspecified eye: Secondary | ICD-10-CM

## 2013-08-16 ENCOUNTER — Emergency Department (HOSPITAL_COMMUNITY)
Admission: EM | Admit: 2013-08-16 | Discharge: 2013-08-16 | Disposition: A | Discharge status: Home / Self Care | Payer: Medicare Other | Attending: Emergency Medicine | Admitting: Emergency Medicine

## 2013-08-16 ENCOUNTER — Encounter (INDEPENDENT_AMBULATORY_CARE_PROVIDER_SITE_OTHER): Payer: Medicare Other | Admitting: Ophthalmology

## 2013-08-16 ENCOUNTER — Encounter (HOSPITAL_COMMUNITY): Payer: Self-pay | Admitting: Emergency Medicine

## 2013-08-16 ENCOUNTER — Emergency Department (HOSPITAL_COMMUNITY): Payer: Medicare Other

## 2013-08-16 DIAGNOSIS — Y939 Activity, unspecified: Secondary | ICD-10-CM | POA: Insufficient documentation

## 2013-08-16 DIAGNOSIS — I10 Essential (primary) hypertension: Secondary | ICD-10-CM

## 2013-08-16 DIAGNOSIS — S0093XA Contusion of unspecified part of head, initial encounter: Secondary | ICD-10-CM

## 2013-08-16 DIAGNOSIS — Z8781 Personal history of (healed) traumatic fracture: Secondary | ICD-10-CM | POA: Insufficient documentation

## 2013-08-16 DIAGNOSIS — Z8669 Personal history of other diseases of the nervous system and sense organs: Secondary | ICD-10-CM | POA: Insufficient documentation

## 2013-08-16 DIAGNOSIS — Y929 Unspecified place or not applicable: Secondary | ICD-10-CM | POA: Insufficient documentation

## 2013-08-16 DIAGNOSIS — Z79899 Other long term (current) drug therapy: Secondary | ICD-10-CM | POA: Insufficient documentation

## 2013-08-16 DIAGNOSIS — S0083XA Contusion of other part of head, initial encounter: Principal | ICD-10-CM

## 2013-08-16 DIAGNOSIS — W19XXXA Unspecified fall, initial encounter: Secondary | ICD-10-CM | POA: Insufficient documentation

## 2013-08-16 DIAGNOSIS — H43819 Vitreous degeneration, unspecified eye: Secondary | ICD-10-CM

## 2013-08-16 DIAGNOSIS — H35039 Hypertensive retinopathy, unspecified eye: Secondary | ICD-10-CM

## 2013-08-16 DIAGNOSIS — E7800 Pure hypercholesterolemia, unspecified: Secondary | ICD-10-CM | POA: Insufficient documentation

## 2013-08-16 DIAGNOSIS — S1093XA Contusion of unspecified part of neck, initial encounter: Principal | ICD-10-CM

## 2013-08-16 DIAGNOSIS — H353290 Exudative age-related macular degeneration, unspecified eye, stage unspecified: Secondary | ICD-10-CM

## 2013-08-16 DIAGNOSIS — S0003XA Contusion of scalp, initial encounter: Secondary | ICD-10-CM | POA: Insufficient documentation

## 2013-08-16 HISTORY — DX: Unspecified macular degeneration: H35.30

## 2013-08-16 HISTORY — DX: Pure hypercholesterolemia, unspecified: E78.00

## 2013-08-16 HISTORY — DX: Essential (primary) hypertension: I10

## 2013-08-16 HISTORY — DX: Other fracture of unspecified lower leg, initial encounter for closed fracture: S82.899A

## 2013-08-16 HISTORY — DX: Fracture of unspecified part of neck of unspecified femur, initial encounter for closed fracture: S72.009A

## 2013-08-16 MED ORDER — BACITRACIN ZINC 500 UNIT/GM EX OINT
TOPICAL_OINTMENT | CUTANEOUS | Status: AC
  Filled 2013-08-16: qty 0.9

## 2013-08-16 NOTE — ED Provider Notes (Signed)
CSN: [id number]     Arrival date & time 08/16/13  2031 History  This chart was scribed for Alyssa Lennert, MD by Quintella Reichert, ED scribe.  This patient was seen in room APA18/APA18 and the patient's care was started at 9:09 PM.   Chief Complaint  Patient presents with  . Fall  . Head Injury    Patient is a 80 y.o. female presenting with head injury. The history is provided by the patient and a relative. No language interpreter was used.  Head Injury Location:  L temporal Time since incident: Unknown.  Sometime today. Mechanism of injury: fall   Pain details:    Severity:  Moderate Chronicity:  New Associated symptoms: no seizures   Risk factors: being elderly     HPI Comments: Alyssa Jacobson is a 80 y.o. female who presents to the Emergency Department complaining of a head injury sustained in a fall pta.  The fall was un-witnessed and pt does not remember the fall.  She lives at home alone and receives home care regularly, but was alone when she fell.  She called her daughter sometime after the fall and told her that she did not know what happened but "something was wrong."  She now presents with a contusion to the left side of her head.  Daughter notes that pt received injections in both eyes today for her macular degeneration.  She is not on blood-thinners.   Past Medical History  Diagnosis Date  . Macular degeneration of both eyes   . Hypertension   . High cholesterol   . Hip fracture   . Ankle fracture     History reviewed. No pertinent past surgical history.  History reviewed. No pertinent family history.   History  Substance Use Topics  . Smoking status: Never Smoker   . Smokeless tobacco: Not on file  . Alcohol Use: No    OB History   Grav Para Term Preterm Abortions TAB SAB Ect Mult Living                   Review of Systems  Constitutional: Negative for appetite change and fatigue.  HENT: Negative for congestion, ear discharge and sinus  pressure.   Eyes: Negative for discharge.  Respiratory: Negative for cough.   Cardiovascular: Negative for chest pain.  Gastrointestinal: Negative for abdominal pain and diarrhea.  Genitourinary: Negative for frequency and hematuria.  Musculoskeletal: Negative for back pain.  Skin: Positive for wound. Negative for rash.  Neurological: Negative for seizures.  Psychiatric/Behavioral: Negative for hallucinations.      Allergies  Sulfa antibiotics  Home Medications   Current Outpatient Rx  Name  Route  Sig  Dispense  Refill  . Calcium Carb-Cholecalciferol (CALCIUM 600 + D PO)   Oral   Take 1 tablet by mouth 2 (two) times daily.         . CRESTOR 20 MG tablet   Oral   Take 1 tablet by mouth daily.         . folic acid (FOLVITE) 800 MCG tablet   Oral   Take 800 mcg by mouth daily.         . multivitamin-lutein (OCUVITE-LUTEIN) CAPS capsule   Oral   Take 1 capsule by mouth daily.         Marland Kitchen PRESCRIPTION MEDICATION   Both Eyes   Place 1 drop into both eyes every 6 (six) weeks. On the day of eye injections.         Marland Kitchen  valsartan (DIOVAN) 320 MG tablet   Oral   Take 1 tablet by mouth daily.           BP 133/88  Pulse 75  Temp(Src) 98.5 F (36.9 C) (Oral)  Resp 18  Ht 5\' 3"  (1.6 m)  Wt 125 lb (56.7 kg)  BMI 22.15 kg/m2  SpO2 96%  Physical Exam  Nursing note and vitals reviewed. Constitutional: She is oriented to person, place, and time. She appears well-developed.  HENT:  Head: Normocephalic.  Bruising and abrasion to left forehead.  Mild tenderness.  Eyes: Conjunctivae and EOM are normal. No scleral icterus.  Neck: Neck supple. No thyromegaly present.  Cardiovascular: Normal rate and regular rhythm.  Exam reveals no gallop and no friction rub.   No murmur heard. Pulmonary/Chest: No stridor. She has no wheezes. She has no rales. She exhibits no tenderness.  Abdominal: She exhibits no distension. There is no tenderness. There is no rebound.   Musculoskeletal: Normal range of motion. She exhibits no edema.  Lymphadenopathy:    She has no cervical adenopathy.  Neurological: She is oriented to person, place, and time. She exhibits normal muscle tone. Coordination normal.  Skin: No rash noted. No erythema.  Psychiatric: She has a normal mood and affect. Her behavior is normal.    ED Course  Procedures (including critical care time)  DIAGNOSTIC STUDIES: Oxygen Saturation is 96% on room air, normal by my interpretation.    COORDINATION OF CARE: 9:17 PM-Discussed treatment plan which includes head CT and CT cervical spine with pt at bedside and pt agreed to plan.     Labs Review Labs Reviewed - No data to display   Imaging Review Ct Head Wo Contrast  08/16/2013   CLINICAL DATA:  Fall, abrasion to forehead.  EXAM: CT HEAD WITHOUT CONTRAST  CT CERVICAL SPINE WITHOUT CONTRAST  TECHNIQUE: Multidetector CT imaging of the head and cervical spine was performed following the standard protocol without intravenous contrast. Multiplanar CT image reconstructions of the cervical spine were also generated.  COMPARISON:  None.  FINDINGS: CT HEAD FINDINGS  There is a small scalp hematoma over the left frontal bone associated skull fracture. No intracranial hemorrhage. No midline shift or mass effect. No parenchymal contusion.  There is generalized cortical atrophy. There is periventricular white matter hypodensities.  No evidence skullbase fracture. No fluid in the paranasal sinuses. Orbits are normal.  CT CERVICAL SPINE FINDINGS  No prevertebral soft tissue swelling. Normal alignment of cervical vertebral bodies. No loss of vertebral body height. Normal facet articulation. Normal craniocervical junction.  No evidence epidural or paraspinal hematoma.  There is endplate spurring at C5 and C6.  IMPRESSION: 1. No intracranial trauma. 2. Small left frontal scalp hematoma. 3. Chronic atrophy and microvascular disease. 4. No orbital injury. 5. No cervical  spine fracture.   Electronically Signed   By: Genevive Bi M.D.   On: 08/16/2013 22:07   Ct Cervical Spine Wo Contrast  08/16/2013   CLINICAL DATA:  Fall, abrasion to forehead.  EXAM: CT HEAD WITHOUT CONTRAST  CT CERVICAL SPINE WITHOUT CONTRAST  TECHNIQUE: Multidetector CT imaging of the head and cervical spine was performed following the standard protocol without intravenous contrast. Multiplanar CT image reconstructions of the cervical spine were also generated.  COMPARISON:  None.  FINDINGS: CT HEAD FINDINGS  There is a small scalp hematoma over the left frontal bone associated skull fracture. No intracranial hemorrhage. No midline shift or mass effect. No parenchymal contusion.  There is generalized  cortical atrophy. There is periventricular white matter hypodensities.  No evidence skullbase fracture. No fluid in the paranasal sinuses. Orbits are normal.  CT CERVICAL SPINE FINDINGS  No prevertebral soft tissue swelling. Normal alignment of cervical vertebral bodies. No loss of vertebral body height. Normal facet articulation. Normal craniocervical junction.  No evidence epidural or paraspinal hematoma.  There is endplate spurring at C5 and C6.  IMPRESSION: 1. No intracranial trauma. 2. Small left frontal scalp hematoma. 3. Chronic atrophy and microvascular disease. 4. No orbital injury. 5. No cervical spine fracture.   Electronically Signed   By: Genevive BiStewart  Edmunds M.D.   On: 08/16/2013 22:07    EKG Interpretation   None       MDM   Final diagnoses:  None   The chart was scribed for me under my direct supervision.  I personally performed the history, physical, and medical decision making and all procedures in the evaluation of this patient.Alyssa Jacobson.   Ataya Murdy L Jaxxon Naeem, MD 08/16/13 [id number]

## 2013-08-16 NOTE — ED Notes (Signed)
Alert, happy, painfree, wants to go home.

## 2013-08-16 NOTE — Discharge Instructions (Signed)
Tylenol for pain.  Follow up with your md for recheck this week.

## 2013-08-16 NOTE — ED Notes (Signed)
Patient possibly fell. Contusion and abrasion noted to left side of head.

## 2013-08-17 ENCOUNTER — Encounter (INDEPENDENT_AMBULATORY_CARE_PROVIDER_SITE_OTHER): Payer: Medicare Other | Admitting: Ophthalmology

## 2013-09-14 ENCOUNTER — Encounter (INDEPENDENT_AMBULATORY_CARE_PROVIDER_SITE_OTHER): Payer: Medicare Other | Admitting: Ophthalmology

## 2013-09-14 DIAGNOSIS — H35039 Hypertensive retinopathy, unspecified eye: Secondary | ICD-10-CM

## 2013-09-14 DIAGNOSIS — I10 Essential (primary) hypertension: Secondary | ICD-10-CM

## 2013-09-14 DIAGNOSIS — H43819 Vitreous degeneration, unspecified eye: Secondary | ICD-10-CM

## 2013-09-14 DIAGNOSIS — H353290 Exudative age-related macular degeneration, unspecified eye, stage unspecified: Secondary | ICD-10-CM

## 2013-10-12 ENCOUNTER — Encounter (INDEPENDENT_AMBULATORY_CARE_PROVIDER_SITE_OTHER): Payer: Medicare Other | Admitting: Ophthalmology

## 2013-10-12 DIAGNOSIS — H35039 Hypertensive retinopathy, unspecified eye: Secondary | ICD-10-CM

## 2013-10-12 DIAGNOSIS — H353290 Exudative age-related macular degeneration, unspecified eye, stage unspecified: Secondary | ICD-10-CM

## 2013-10-12 DIAGNOSIS — H43819 Vitreous degeneration, unspecified eye: Secondary | ICD-10-CM

## 2013-10-12 DIAGNOSIS — I10 Essential (primary) hypertension: Secondary | ICD-10-CM

## 2013-11-09 ENCOUNTER — Encounter (INDEPENDENT_AMBULATORY_CARE_PROVIDER_SITE_OTHER): Payer: Medicare Other | Admitting: Ophthalmology

## 2013-11-09 DIAGNOSIS — I10 Essential (primary) hypertension: Secondary | ICD-10-CM

## 2013-11-09 DIAGNOSIS — H353290 Exudative age-related macular degeneration, unspecified eye, stage unspecified: Secondary | ICD-10-CM

## 2013-11-09 DIAGNOSIS — H35039 Hypertensive retinopathy, unspecified eye: Secondary | ICD-10-CM

## 2013-11-09 DIAGNOSIS — H43819 Vitreous degeneration, unspecified eye: Secondary | ICD-10-CM

## 2013-12-07 ENCOUNTER — Encounter (INDEPENDENT_AMBULATORY_CARE_PROVIDER_SITE_OTHER): Payer: Medicare Other | Admitting: Ophthalmology

## 2013-12-07 DIAGNOSIS — I10 Essential (primary) hypertension: Secondary | ICD-10-CM

## 2013-12-07 DIAGNOSIS — H353290 Exudative age-related macular degeneration, unspecified eye, stage unspecified: Secondary | ICD-10-CM

## 2013-12-07 DIAGNOSIS — H35039 Hypertensive retinopathy, unspecified eye: Secondary | ICD-10-CM

## 2013-12-07 DIAGNOSIS — H43819 Vitreous degeneration, unspecified eye: Secondary | ICD-10-CM

## 2014-01-04 ENCOUNTER — Encounter (INDEPENDENT_AMBULATORY_CARE_PROVIDER_SITE_OTHER): Payer: Medicare Other | Admitting: Ophthalmology

## 2014-01-04 DIAGNOSIS — H35039 Hypertensive retinopathy, unspecified eye: Secondary | ICD-10-CM

## 2014-01-04 DIAGNOSIS — H353290 Exudative age-related macular degeneration, unspecified eye, stage unspecified: Secondary | ICD-10-CM

## 2014-01-04 DIAGNOSIS — I10 Essential (primary) hypertension: Secondary | ICD-10-CM

## 2014-01-04 DIAGNOSIS — H43819 Vitreous degeneration, unspecified eye: Secondary | ICD-10-CM

## 2014-02-08 ENCOUNTER — Encounter (INDEPENDENT_AMBULATORY_CARE_PROVIDER_SITE_OTHER): Payer: Medicare Other | Admitting: Ophthalmology

## 2014-02-08 DIAGNOSIS — H43819 Vitreous degeneration, unspecified eye: Secondary | ICD-10-CM

## 2014-02-08 DIAGNOSIS — I10 Essential (primary) hypertension: Secondary | ICD-10-CM

## 2014-02-08 DIAGNOSIS — H35039 Hypertensive retinopathy, unspecified eye: Secondary | ICD-10-CM

## 2014-02-08 DIAGNOSIS — H353290 Exudative age-related macular degeneration, unspecified eye, stage unspecified: Secondary | ICD-10-CM

## 2014-03-08 ENCOUNTER — Encounter (INDEPENDENT_AMBULATORY_CARE_PROVIDER_SITE_OTHER): Payer: Medicare Other | Admitting: Ophthalmology

## 2014-03-08 DIAGNOSIS — I10 Essential (primary) hypertension: Secondary | ICD-10-CM

## 2014-03-08 DIAGNOSIS — H43819 Vitreous degeneration, unspecified eye: Secondary | ICD-10-CM

## 2014-03-08 DIAGNOSIS — H35039 Hypertensive retinopathy, unspecified eye: Secondary | ICD-10-CM

## 2014-03-08 DIAGNOSIS — H353290 Exudative age-related macular degeneration, unspecified eye, stage unspecified: Secondary | ICD-10-CM

## 2014-04-05 ENCOUNTER — Encounter (INDEPENDENT_AMBULATORY_CARE_PROVIDER_SITE_OTHER): Payer: Medicare Other | Admitting: Ophthalmology

## 2014-04-05 DIAGNOSIS — H43813 Vitreous degeneration, bilateral: Secondary | ICD-10-CM

## 2014-04-05 DIAGNOSIS — H3532 Exudative age-related macular degeneration: Secondary | ICD-10-CM

## 2014-04-05 DIAGNOSIS — H35033 Hypertensive retinopathy, bilateral: Secondary | ICD-10-CM

## 2014-05-03 ENCOUNTER — Encounter (INDEPENDENT_AMBULATORY_CARE_PROVIDER_SITE_OTHER): Payer: Medicare Other | Admitting: Ophthalmology

## 2014-05-03 DIAGNOSIS — H35033 Hypertensive retinopathy, bilateral: Secondary | ICD-10-CM

## 2014-05-03 DIAGNOSIS — H3532 Exudative age-related macular degeneration: Secondary | ICD-10-CM

## 2014-05-03 DIAGNOSIS — I10 Essential (primary) hypertension: Secondary | ICD-10-CM

## 2014-05-03 DIAGNOSIS — H43813 Vitreous degeneration, bilateral: Secondary | ICD-10-CM

## 2014-05-31 ENCOUNTER — Encounter (INDEPENDENT_AMBULATORY_CARE_PROVIDER_SITE_OTHER): Payer: Medicare Other | Admitting: Ophthalmology

## 2014-05-31 DIAGNOSIS — H43813 Vitreous degeneration, bilateral: Secondary | ICD-10-CM

## 2014-05-31 DIAGNOSIS — I10 Essential (primary) hypertension: Secondary | ICD-10-CM

## 2014-05-31 DIAGNOSIS — H35033 Hypertensive retinopathy, bilateral: Secondary | ICD-10-CM

## 2014-05-31 DIAGNOSIS — H3532 Exudative age-related macular degeneration: Secondary | ICD-10-CM

## 2014-06-28 ENCOUNTER — Encounter (INDEPENDENT_AMBULATORY_CARE_PROVIDER_SITE_OTHER): Payer: Medicare Other | Admitting: Ophthalmology

## 2014-06-28 DIAGNOSIS — I10 Essential (primary) hypertension: Secondary | ICD-10-CM

## 2014-06-28 DIAGNOSIS — H3532 Exudative age-related macular degeneration: Secondary | ICD-10-CM

## 2014-06-28 DIAGNOSIS — H43813 Vitreous degeneration, bilateral: Secondary | ICD-10-CM

## 2014-06-28 DIAGNOSIS — H35033 Hypertensive retinopathy, bilateral: Secondary | ICD-10-CM

## 2014-07-27 ENCOUNTER — Encounter (INDEPENDENT_AMBULATORY_CARE_PROVIDER_SITE_OTHER): Payer: Medicare Other | Admitting: Ophthalmology

## 2014-07-27 DIAGNOSIS — H3532 Exudative age-related macular degeneration: Secondary | ICD-10-CM

## 2014-08-23 ENCOUNTER — Encounter (INDEPENDENT_AMBULATORY_CARE_PROVIDER_SITE_OTHER): Payer: Medicare Other | Admitting: Ophthalmology

## 2014-08-23 DIAGNOSIS — H3532 Exudative age-related macular degeneration: Secondary | ICD-10-CM

## 2014-09-20 ENCOUNTER — Encounter (INDEPENDENT_AMBULATORY_CARE_PROVIDER_SITE_OTHER): Payer: Medicare Other | Admitting: Ophthalmology

## 2014-09-20 DIAGNOSIS — H3532 Exudative age-related macular degeneration: Secondary | ICD-10-CM

## 2014-09-20 DIAGNOSIS — I10 Essential (primary) hypertension: Secondary | ICD-10-CM

## 2014-09-20 DIAGNOSIS — H35033 Hypertensive retinopathy, bilateral: Secondary | ICD-10-CM | POA: Diagnosis not present

## 2014-09-20 DIAGNOSIS — H43813 Vitreous degeneration, bilateral: Secondary | ICD-10-CM

## 2014-10-18 ENCOUNTER — Encounter (INDEPENDENT_AMBULATORY_CARE_PROVIDER_SITE_OTHER): Payer: Medicare Other | Admitting: Ophthalmology

## 2014-10-18 DIAGNOSIS — H3532 Exudative age-related macular degeneration: Secondary | ICD-10-CM | POA: Diagnosis not present

## 2014-10-18 DIAGNOSIS — H43813 Vitreous degeneration, bilateral: Secondary | ICD-10-CM

## 2014-10-18 DIAGNOSIS — H35033 Hypertensive retinopathy, bilateral: Secondary | ICD-10-CM | POA: Diagnosis not present

## 2014-10-18 DIAGNOSIS — I10 Essential (primary) hypertension: Secondary | ICD-10-CM

## 2014-11-15 ENCOUNTER — Encounter (INDEPENDENT_AMBULATORY_CARE_PROVIDER_SITE_OTHER): Payer: Medicare Other | Admitting: Ophthalmology

## 2014-11-15 DIAGNOSIS — H43813 Vitreous degeneration, bilateral: Secondary | ICD-10-CM

## 2014-11-15 DIAGNOSIS — H35033 Hypertensive retinopathy, bilateral: Secondary | ICD-10-CM

## 2014-11-15 DIAGNOSIS — I10 Essential (primary) hypertension: Secondary | ICD-10-CM

## 2014-11-15 DIAGNOSIS — H3532 Exudative age-related macular degeneration: Secondary | ICD-10-CM

## 2014-12-13 ENCOUNTER — Encounter (INDEPENDENT_AMBULATORY_CARE_PROVIDER_SITE_OTHER): Payer: Medicare Other | Admitting: Ophthalmology

## 2014-12-13 DIAGNOSIS — H35033 Hypertensive retinopathy, bilateral: Secondary | ICD-10-CM

## 2014-12-13 DIAGNOSIS — I10 Essential (primary) hypertension: Secondary | ICD-10-CM | POA: Diagnosis not present

## 2014-12-13 DIAGNOSIS — H43813 Vitreous degeneration, bilateral: Secondary | ICD-10-CM

## 2014-12-13 DIAGNOSIS — H3532 Exudative age-related macular degeneration: Secondary | ICD-10-CM | POA: Diagnosis not present

## 2015-01-10 ENCOUNTER — Encounter (INDEPENDENT_AMBULATORY_CARE_PROVIDER_SITE_OTHER): Payer: Medicare Other | Admitting: Ophthalmology

## 2015-01-10 DIAGNOSIS — H3532 Exudative age-related macular degeneration: Secondary | ICD-10-CM | POA: Diagnosis not present

## 2015-01-10 DIAGNOSIS — I10 Essential (primary) hypertension: Secondary | ICD-10-CM | POA: Diagnosis not present

## 2015-01-10 DIAGNOSIS — H35033 Hypertensive retinopathy, bilateral: Secondary | ICD-10-CM | POA: Diagnosis not present

## 2015-01-10 DIAGNOSIS — H43813 Vitreous degeneration, bilateral: Secondary | ICD-10-CM | POA: Diagnosis not present

## 2015-01-18 ENCOUNTER — Other Ambulatory Visit (HOSPITAL_COMMUNITY): Payer: Self-pay | Admitting: Internal Medicine

## 2015-01-18 ENCOUNTER — Ambulatory Visit (HOSPITAL_COMMUNITY)
Admission: RE | Admit: 2015-01-18 | Discharge: 2015-01-18 | Disposition: A | Discharge status: Home / Self Care | Payer: Medicare Other | Source: Ambulatory Visit | Attending: Internal Medicine | Admitting: Internal Medicine

## 2015-01-18 DIAGNOSIS — M79672 Pain in left foot: Secondary | ICD-10-CM | POA: Insufficient documentation

## 2015-02-06 ENCOUNTER — Encounter (INDEPENDENT_AMBULATORY_CARE_PROVIDER_SITE_OTHER): Payer: Medicare Other | Admitting: Ophthalmology

## 2015-02-06 DIAGNOSIS — H3532 Exudative age-related macular degeneration: Secondary | ICD-10-CM | POA: Diagnosis not present

## 2015-02-06 DIAGNOSIS — H35033 Hypertensive retinopathy, bilateral: Secondary | ICD-10-CM | POA: Diagnosis not present

## 2015-02-06 DIAGNOSIS — H43813 Vitreous degeneration, bilateral: Secondary | ICD-10-CM | POA: Diagnosis not present

## 2015-02-06 DIAGNOSIS — I10 Essential (primary) hypertension: Secondary | ICD-10-CM | POA: Diagnosis not present

## 2015-03-14 ENCOUNTER — Encounter (INDEPENDENT_AMBULATORY_CARE_PROVIDER_SITE_OTHER): Payer: Medicare Other | Admitting: Ophthalmology

## 2015-03-14 DIAGNOSIS — H43813 Vitreous degeneration, bilateral: Secondary | ICD-10-CM | POA: Diagnosis not present

## 2015-03-14 DIAGNOSIS — H35033 Hypertensive retinopathy, bilateral: Secondary | ICD-10-CM

## 2015-03-14 DIAGNOSIS — I10 Essential (primary) hypertension: Secondary | ICD-10-CM

## 2015-03-14 DIAGNOSIS — H3532 Exudative age-related macular degeneration: Secondary | ICD-10-CM

## 2015-04-18 ENCOUNTER — Encounter (INDEPENDENT_AMBULATORY_CARE_PROVIDER_SITE_OTHER): Payer: Medicare Other | Admitting: Ophthalmology

## 2015-04-20 ENCOUNTER — Encounter (INDEPENDENT_AMBULATORY_CARE_PROVIDER_SITE_OTHER): Payer: Medicare Other | Admitting: Ophthalmology

## 2015-04-20 DIAGNOSIS — H353231 Exudative age-related macular degeneration, bilateral, with active choroidal neovascularization: Secondary | ICD-10-CM

## 2015-04-20 DIAGNOSIS — I10 Essential (primary) hypertension: Secondary | ICD-10-CM | POA: Diagnosis not present

## 2015-04-20 DIAGNOSIS — H43813 Vitreous degeneration, bilateral: Secondary | ICD-10-CM | POA: Diagnosis not present

## 2015-04-20 DIAGNOSIS — H35033 Hypertensive retinopathy, bilateral: Secondary | ICD-10-CM | POA: Diagnosis not present

## 2015-05-02 ENCOUNTER — Emergency Department (HOSPITAL_COMMUNITY): Payer: Medicare Other

## 2015-05-02 ENCOUNTER — Encounter (HOSPITAL_COMMUNITY): Payer: Self-pay | Admitting: *Deleted

## 2015-05-02 ENCOUNTER — Inpatient Hospital Stay (HOSPITAL_COMMUNITY)
Admission: EM | Admit: 2015-05-02 | Discharge: 2015-05-05 | DRG: 981 | Disposition: A | Discharge status: Skilled Nursing Facility | Payer: Medicare Other | Attending: Internal Medicine | Admitting: Internal Medicine

## 2015-05-02 DIAGNOSIS — D72829 Elevated white blood cell count, unspecified: Secondary | ICD-10-CM | POA: Diagnosis present

## 2015-05-02 DIAGNOSIS — E872 Acidosis, unspecified: Secondary | ICD-10-CM | POA: Diagnosis present

## 2015-05-02 DIAGNOSIS — F439 Reaction to severe stress, unspecified: Secondary | ICD-10-CM | POA: Diagnosis not present

## 2015-05-02 DIAGNOSIS — M25551 Pain in right hip: Secondary | ICD-10-CM | POA: Diagnosis present

## 2015-05-02 DIAGNOSIS — H3530 Unspecified macular degeneration: Principal | ICD-10-CM | POA: Diagnosis present

## 2015-05-02 DIAGNOSIS — S72141A Displaced intertrochanteric fracture of right femur, initial encounter for closed fracture: Secondary | ICD-10-CM | POA: Diagnosis present

## 2015-05-02 DIAGNOSIS — W19XXXA Unspecified fall, initial encounter: Secondary | ICD-10-CM | POA: Diagnosis present

## 2015-05-02 DIAGNOSIS — S72009A Fracture of unspecified part of neck of unspecified femur, initial encounter for closed fracture: Secondary | ICD-10-CM

## 2015-05-02 DIAGNOSIS — Z01818 Encounter for other preprocedural examination: Secondary | ICD-10-CM

## 2015-05-02 DIAGNOSIS — N390 Urinary tract infection, site not specified: Secondary | ICD-10-CM | POA: Diagnosis present

## 2015-05-02 DIAGNOSIS — Z419 Encounter for procedure for purposes other than remedying health state, unspecified: Secondary | ICD-10-CM

## 2015-05-02 DIAGNOSIS — F0390 Unspecified dementia without behavioral disturbance: Secondary | ICD-10-CM | POA: Diagnosis present

## 2015-05-02 DIAGNOSIS — E785 Hyperlipidemia, unspecified: Secondary | ICD-10-CM | POA: Diagnosis present

## 2015-05-02 DIAGNOSIS — I10 Essential (primary) hypertension: Secondary | ICD-10-CM | POA: Diagnosis present

## 2015-05-02 DIAGNOSIS — D62 Acute posthemorrhagic anemia: Secondary | ICD-10-CM | POA: Diagnosis not present

## 2015-05-02 DIAGNOSIS — E7800 Pure hypercholesterolemia, unspecified: Secondary | ICD-10-CM | POA: Diagnosis not present

## 2015-05-02 LAB — HEPATIC FUNCTION PANEL
ALK PHOS: 48 U/L (ref 38–126)
ALT: 14 U/L (ref 14–54)
AST: 25 U/L (ref 15–41)
Albumin: 3.7 g/dL (ref 3.5–5.0)
BILIRUBIN INDIRECT: 0.4 mg/dL (ref 0.3–0.9)
Bilirubin, Direct: 0.2 mg/dL (ref 0.1–0.5)
TOTAL PROTEIN: 6.5 g/dL (ref 6.5–8.1)
Total Bilirubin: 0.6 mg/dL (ref 0.3–1.2)

## 2015-05-02 LAB — BASIC METABOLIC PANEL
ANION GAP: 11 (ref 5–15)
BUN: 13 mg/dL (ref 6–20)
CALCIUM: 9.1 mg/dL (ref 8.9–10.3)
CO2: 26 mmol/L (ref 22–32)
CREATININE: 0.91 mg/dL (ref 0.44–1.00)
Chloride: 100 mmol/L — ABNORMAL LOW (ref 101–111)
GFR calc non Af Amer: 54 mL/min — ABNORMAL LOW (ref >60)
Glucose, Bld: 134 mg/dL — ABNORMAL HIGH (ref 65–99)
Potassium: 4.1 mmol/L (ref 3.5–5.1)
Sodium: 137 mmol/L (ref 135–145)

## 2015-05-02 LAB — URINE MICROSCOPIC-ADD ON

## 2015-05-02 LAB — URINALYSIS, ROUTINE W REFLEX MICROSCOPIC
Bilirubin Urine: NEGATIVE
Glucose, UA: NEGATIVE mg/dL
KETONES UR: 15 mg/dL — AB
Nitrite: POSITIVE — AB
Specific Gravity, Urine: 1.02 (ref 1.005–1.030)
UROBILINOGEN UA: 1 mg/dL (ref 0.0–1.0)
pH: 8 (ref 5.0–8.0)

## 2015-05-02 LAB — CBC WITH DIFFERENTIAL/PLATELET
BASOS ABS: 0 10*3/uL (ref 0.0–0.1)
BASOS PCT: 0 %
Eosinophils Absolute: 0.1 10*3/uL (ref 0.0–0.7)
Eosinophils Relative: 1 %
HEMATOCRIT: 37.6 % (ref 36.0–46.0)
Hemoglobin: 12.1 g/dL (ref 12.0–15.0)
Lymphocytes Relative: 8 %
Lymphs Abs: 1.3 10*3/uL (ref 0.7–4.0)
MCH: 31.1 pg (ref 26.0–34.0)
MCHC: 32.2 g/dL (ref 30.0–36.0)
MCV: 96.7 fL (ref 78.0–100.0)
MONO ABS: 1.1 10*3/uL — AB (ref 0.1–1.0)
Monocytes Relative: 7 %
NEUTROS ABS: 12.7 10*3/uL — AB (ref 1.7–7.7)
NEUTROS PCT: 84 %
Platelets: 221 10*3/uL (ref 150–400)
RBC: 3.89 MIL/uL (ref 3.87–5.11)
RDW: 13.1 % (ref 11.5–15.5)
WBC: 15.2 10*3/uL — AB (ref 4.0–10.5)

## 2015-05-02 LAB — PROTIME-INR
INR: 1.17 (ref 0.00–1.49)
PROTHROMBIN TIME: 15 s (ref 11.6–15.2)

## 2015-05-02 LAB — I-STAT CG4 LACTIC ACID, ED: LACTIC ACID, VENOUS: 2.3 mmol/L — AB (ref 0.5–2.0)

## 2015-05-02 LAB — APTT: aPTT: 27 seconds (ref 24–37)

## 2015-05-02 MED ORDER — DEXTROSE 5 % IV SOLN
1.0000 g | INTRAVENOUS | Status: DC
  Administered 2015-05-02 – 2015-05-03 (×2): 1 g via INTRAVENOUS
  Filled 2015-05-02 (×2): qty 10

## 2015-05-02 MED ORDER — FERROUS SULFATE 325 (65 FE) MG PO TABS
325.0000 mg | ORAL_TABLET | Freq: Three times a day (TID) | ORAL | Status: DC
  Administered 2015-05-03 – 2015-05-05 (×6): 325 mg via ORAL
  Filled 2015-05-02 (×6): qty 1

## 2015-05-02 MED ORDER — ALPRAZOLAM 0.25 MG PO TABS: 0.2500 mg | ORAL_TABLET | Freq: Two times a day (BID) | ORAL | Status: DC | PRN

## 2015-05-02 MED ORDER — ROSUVASTATIN CALCIUM 20 MG PO TABS
20.0000 mg | ORAL_TABLET | Freq: Every day | ORAL | Status: DC
  Administered 2015-05-03 – 2015-05-04 (×2): 20 mg via ORAL
  Filled 2015-05-02 (×3): qty 1

## 2015-05-02 MED ORDER — DEXTROMETHORPHAN-QUINIDINE 20-10 MG PO CAPS: 1.0000 | ORAL_CAPSULE | Freq: Two times a day (BID) | ORAL | Status: DC

## 2015-05-02 MED ORDER — MORPHINE SULFATE (PF) 4 MG/ML IV SOLN
6.0000 mg | Freq: Once | INTRAVENOUS | Status: AC
  Administered 2015-05-02: 6 mg via INTRAVENOUS
  Filled 2015-05-02: qty 2

## 2015-05-02 MED ORDER — SENNA 8.6 MG PO TABS
1.0000 | ORAL_TABLET | Freq: Every day | ORAL | Status: DC
  Administered 2015-05-03 – 2015-05-05 (×3): 8.6 mg via ORAL
  Filled 2015-05-02 (×3): qty 1

## 2015-05-02 MED ORDER — METHOCARBAMOL 500 MG PO TABS
500.0000 mg | ORAL_TABLET | Freq: Four times a day (QID) | ORAL | Status: DC | PRN
  Administered 2015-05-03: 500 mg via ORAL
  Filled 2015-05-02: qty 1

## 2015-05-02 MED ORDER — METHOCARBAMOL 1000 MG/10ML IJ SOLN
500.0000 mg | Freq: Four times a day (QID) | INTRAVENOUS | Status: DC | PRN
  Filled 2015-05-02: qty 5

## 2015-05-02 MED ORDER — SODIUM CHLORIDE 0.9 % IV SOLN
INTRAVENOUS | Status: DC
  Administered 2015-05-02: 23:00:00 via INTRAVENOUS

## 2015-05-02 MED ORDER — HYDROCODONE-ACETAMINOPHEN 5-325 MG PO TABS
1.0000 | ORAL_TABLET | Freq: Four times a day (QID) | ORAL | Status: DC | PRN
  Administered 2015-05-04 – 2015-05-05 (×3): 2 via ORAL
  Filled 2015-05-02: qty 2
  Filled 2015-05-02: qty 1
  Filled 2015-05-02 (×2): qty 2

## 2015-05-02 MED ORDER — SODIUM CHLORIDE 0.9 % IV BOLUS (SEPSIS)
500.0000 mL | Freq: Once | INTRAVENOUS | Status: AC
  Administered 2015-05-02: 500 mL via INTRAVENOUS

## 2015-05-02 MED ORDER — SODIUM CHLORIDE 0.9 % IV BOLUS (SEPSIS): 1000.0000 mL | Freq: Once | INTRAVENOUS | Status: DC

## 2015-05-02 MED ORDER — MORPHINE SULFATE (PF) 2 MG/ML IV SOLN
2.0000 mg | INTRAVENOUS | Status: DC | PRN
  Administered 2015-05-03: 2 mg via INTRAVENOUS
  Filled 2015-05-02: qty 1

## 2015-05-02 MED ORDER — HEPARIN SODIUM (PORCINE) 5000 UNIT/ML IJ SOLN: 5000.0000 [IU] | Freq: Three times a day (TID) | INTRAMUSCULAR | Status: DC

## 2015-05-02 NOTE — ED Notes (Signed)
Report given to [facility]. 

## 2015-05-02 NOTE — ED Notes (Signed)
[facility]

## 2015-05-02 NOTE — Progress Notes (Signed)
.   ANTIBIOTIC CONSULT NOTE  Pharmacy Consult for Rocephin Indication: UTI  Allergies  Allergen Reactions  . Sulfa Antibiotics Other (See Comments)    unknown    Patient Measurements: Height: 5\' 3"  (160 cm) Weight: 125 lb (56.7 kg) IBW/kg (Calculated) : 52.4  Vital Signs: Temp: 97.9 F (36.6 C) (11/01 1914) Temp Source: Oral (11/01 1914) BP: 105/55 mmHg (11/01 2000) Pulse Rate: 62 (11/01 2000) Intake/Output from previous day:   Intake/Output from this shift:    Labs:  Recent Labs  05/02/15 2038  WBC 15.2*  HGB 12.1  PLT 221  CREATININE 0.91   Estimated Creatinine Clearance: 34.7 mL/min (by C-G formula based on Cr of 0.91). No results for input(s): VANCOTROUGH, VANCOPEAK, VANCORANDOM, GENTTROUGH, GENTPEAK, GENTRANDOM, TOBRATROUGH, TOBRAPEAK, TOBRARND, AMIKACINPEAK, AMIKACINTROU, AMIKACIN in the last 72 hours.   Microbiology: No results found for this or any previous visit (from the past 720 hour(s)).  Anti-infectives    None      Assessment: Okay for Protocol, No pharmacokinetic or renal monitoring needed.  Goal of Therapy:  Eradicate infection.   Plan:  Rocephin 1gm IV every 24 hours. Sign off.  Lamonte RicherHayes, [place] Serpe R 05/02/2015,9:47 PM

## 2015-05-02 NOTE — ED Provider Notes (Signed)
CSN: [id number]     Arrival date & time 05/02/15  1910 History   First MD Initiated Contact with Patient 05/02/15 1922     Chief Complaint  Patient presents with  . Fall     (Consider location/radiation/quality/duration/timing/severity/associated sxs/prior Treatment) Patient is a 81 y.o. female presenting with hip pain.  Hip Pain This is a new problem. The current episode started less than 1 hour ago. The problem occurs constantly. The problem has not changed since onset.Pertinent negatives include no chest pain, no abdominal pain, no headaches and no shortness of breath. Nothing aggravates the symptoms. Nothing relieves the symptoms. She has tried nothing for the symptoms. The treatment provided no relief.    Past Medical History  Diagnosis Date  . Macular degeneration of both eyes   . Hypertension   . High cholesterol   . Hip fracture (HCC)   . Ankle fracture    History reviewed. No pertinent past surgical history. History reviewed. No pertinent family history. Social History  Substance Use Topics  . Smoking status: Never Smoker   . Smokeless tobacco: None  . Alcohol Use: No   OB History    No data available     Review of Systems  Constitutional: Negative for fever and chills.  HENT: Negative for congestion and drooling.   Eyes: Negative for photophobia and pain.  Respiratory: Negative for shortness of breath.   Cardiovascular: Negative for chest pain.  Gastrointestinal: Negative for nausea, vomiting and abdominal pain.  Endocrine: Negative for polydipsia and polyuria.  Genitourinary: Negative for dysuria.  Musculoskeletal: Positive for gait problem.       Right hip pain  Neurological: Negative for syncope and headaches.  All other systems reviewed and are negative.     Allergies  Sulfa antibiotics  Home Medications   Prior to Admission medications   Medication Sig Start Date End Date Taking? Authorizing Provider  Calcium Carb-Cholecalciferol (CALCIUM 600  + D PO) Take 1 tablet by mouth 2 (two) times daily.   Yes Historical Provider, MD  CRESTOR 20 MG tablet Take 1 tablet by mouth daily. 07/23/13  Yes Historical Provider, MD  folic acid (FOLVITE) 800 MCG tablet Take 800 mcg by mouth daily.   Yes Historical Provider, MD  multivitamin-lutein (OCUVITE-LUTEIN) CAPS capsule Take 1 capsule by mouth daily.   Yes Historical Provider, MD  NUEDEXTA 20-10 MG CAPS Take 1 capsule by mouth 2 (two) times daily. 03/29/15  Yes Historical Provider, MD  PRESCRIPTION MEDICATION Place 1 drop into both eyes every 6 (six) weeks. On the day of eye injections.   Yes Historical Provider, MD  valsartan (DIOVAN) 320 MG tablet Take 1 tablet by mouth daily. 07/23/13  Yes Historical Provider, MD  aspirin 325 MG tablet Take 1 tablet (325 mg total) by mouth daily. 05/03/15   Brittney Tresa Endo, PA-C  HYDROcodone-acetaminophen (NORCO) 5-325 MG tablet Take 1-2 tablets by mouth every 6 (six) hours as needed for moderate pain. 05/03/15   Brittney Tresa Endo, PA-C   BP 116/54 mmHg  Pulse 103  Temp(Src) 99.1 F (37.3 C) (Oral)  Resp 18  Ht  (1.6 m)  Wt 131 lb 12.8 oz (59.784 kg)  BMI 23.35 kg/m2  SpO2 91% Physical Exam  Constitutional: She appears well-developed and well-nourished.  HENT:  Head: Normocephalic and atraumatic.  Neck: Normal range of motion.  Cardiovascular: Normal rate and regular rhythm.   Pulmonary/Chest: No stridor. No respiratory distress.  Abdominal: She exhibits no distension.  Musculoskeletal: She exhibits tenderness (right hip  w/ ROM, right leg is foreshortened).  Neurological: She is alert.  Nursing note and vitals reviewed.   ED Course  Procedures (including critical care time) Labs Review Labs Reviewed  SURGICAL PCR SCREEN - Abnormal; Notable for the following:    Staphylococcus aureus POSITIVE (*)    All other components within normal limits  CBC WITH DIFFERENTIAL/PLATELET - Abnormal; Notable for the following:    WBC 15.2 (*)    Neutro Abs 12.7  (*)    Monocytes Absolute 1.1 (*)    All other components within normal limits  BASIC METABOLIC PANEL - Abnormal; Notable for the following:    Chloride 100 (*)    Glucose, Bld 134 (*)    GFR calc non Af Amer 54 (*)    All other components within normal limits  URINALYSIS, ROUTINE W REFLEX MICROSCOPIC (NOT AT [facility]) - Abnormal; Notable for the following:    APPearance HAZY (*)    Hgb urine dipstick TRACE (*)    Ketones, ur 15 (*)    Protein, ur TRACE (*)    Nitrite POSITIVE (*)    Leukocytes, UA MODERATE (*)    All other components within normal limits  URINE MICROSCOPIC-ADD ON - Abnormal; Notable for the following:    Squamous Epithelial / LPF FEW (*)    Bacteria, UA MANY (*)    All other components within normal limits  CBC WITH DIFFERENTIAL/PLATELET - Abnormal; Notable for the following:    WBC 16.5 (*)    RBC 3.62 (*)    Hemoglobin 11.4 (*)    HCT 34.8 (*)    Neutro Abs 14.1 (*)    Monocytes Absolute 1.2 (*)    All other components within normal limits  COMPREHENSIVE METABOLIC PANEL - Abnormal; Notable for the following:    Chloride 100 (*)    Glucose, Bld 156 (*)    Calcium 8.7 (*)    Total Protein 6.1 (*)    Albumin 3.2 (*)    AST 14 (*)    All other components within normal limits  I-STAT CG4 LACTIC ACID, ED - Abnormal; Notable for the following:    Lactic Acid, Venous 2.30 (*)    All other components within normal limits  URINE CULTURE  HEPATIC FUNCTION PANEL  PROTIME-INR  APTT  LACTIC ACID, PLASMA  CBC  BASIC METABOLIC PANEL    Imaging Review Dg Knee 2 Views Right  05/02/2015  CLINICAL DATA:  Patient fell at home today, unwitnessed. Pain right knee with movement. EXAM: RIGHT KNEE - 1-2 VIEW COMPARISON:  None. FINDINGS: Tricompartment degenerative changes in the right knee. Prominent narrowing of the medial and lateral compartments with tricompartment osteophytosis. Articular cartilage calcification consistent with chondrocalcinosis. No evidence of acute  fracture or dislocation. No focal bone lesion or bone destruction. No significant effusion. Vascular calcifications. IMPRESSION: Tricompartment degenerative changes in the right knee. No acute bony abnormalities. Electronically Signed   By: Burman Nieves M.D.   On: 05/02/2015 20:15   Dg Chest Port 1 View  05/02/2015  CLINICAL DATA:  81 year old female with a history of preop chest x-ray EXAM: PORTABLE CHEST 1 VIEW COMPARISON:  03/25/2004, 03/23/2004 FINDINGS: Cardiomediastinal silhouette likely unchanged, partially obscured by overlying lung and pleural disease. Persistent elevation of the left hemidiaphragm. Similar appearance of mixed interstitial and airspace disease bilaterally. No pneumothorax. No large pleural effusion. No displaced fracture. IMPRESSION: Similar appearance of prior chest x-rays with persisting chronic interstitial and airspace opacities, likely a combination of atelectasis and scarring given  the history, with no evidence of new lobar pneumonia. Signed, Yvone NeuJaime S. Loreta AveWagner, DO [facility] Electronically Signed   By: Gilmer MorJaime  Wagner D.O.   On: 05/02/2015 21:11   Dg Hip Operative Unilat With Pelvis Right  05/03/2015  CLINICAL DATA:  Status post internal fixation of right hip fracture. EXAM: OPERATIVE right HIP (WITH PELVIS IF PERFORMED) 3 VIEWS TECHNIQUE: Fluoroscopic spot image(s) were submitted for interpretation post-operatively. FLUOROSCOPY TIME:  40 seconds. COMPARISON:  May 02, 2015. FINDINGS: Status post surgical internal fixation of intertrochanteric fracture of proximal right femur. Good alignment of fracture components is noted. IMPRESSION: Status post surgical internal fixation of proximal right femoral fracture. Electronically Signed   By: Lupita RaiderJames  Green Jr, M.D.   On: 05/03/2015 13:54   Dg Hip Unilat With Pelvis 2-3 Views Right  05/02/2015  CLINICAL DATA:  Patient fell at home today, unwitnessed. Severe right hip  pain. EXAM: DG HIP (WITH OR WITHOUT PELVIS) 2-3V RIGHT COMPARISON:  None. FINDINGS: Degenerative changes in the right hip. Abnormal rotation of the right hip. Cortical lucencies consistent with nondisplaced inter trochanteric fracture. Mild impaction of fracture fragments. No dislocation in the right hip joint. Degenerative changes also demonstrated in the lower lumbar spine and left hip. Two screws fix the left femoral neck. Pelvis appears intact. Visualize sacrum appears intact. SI joints and symphysis pubis are not displaced. IMPRESSION: Minimally displaced and impacted inter trochanteric fracture of the right hip. Electronically Signed   By: Burman NievesWilliam  Stevens M.D.   On: 05/02/2015 20:13   I have personally reviewed and evaluated these images and lab results as part of my medical decision-making.   EKG Interpretation None      MDM   Final diagnoses:  Preop examination  Essential hypertension  High cholesterol  Surgery, elective  Fracture, intertrochanteric, right femur, closed, initial encounter [facility]HCC)  Hip fracture requiring operative repair (HCC)   81 yo w/ mechanical fall. Right hip pain. Normal pulse. Fractured on XR. D/W hospitalist and orthopedist, will admit to hospitalist for surgery tomrorow.       Marily MemosJason Rene Sizelove, MD 05/04/15 0001

## 2015-05-02 NOTE — ED Notes (Signed)
Pt brought in by [facility] for c/o fall; pt fell on her right hip and is c/o pain; pt has bruise to right lower thigh; pt was given 6mg  of morphine by [facility];

## 2015-05-02 NOTE — H&P (Signed)
[facility] History and Physical  Patient: Alyssa Jacobson  MRN: [id number]  DOB: [birth year]  DOS: the patient was seen and examined on 05/02/2015 PCP: Dwana Melena, MD  Referring physician: Dr. Erin Hearing Chief Complaint: Fall  HPI: Alyssa Jacobson is a 81 y.o. female with Past medical history of hypertension, dyslipidemia, dementia. The patient is presenting with complaints of a fall. The fall was not witnessed. Patient at time of my evaluation denies having any complaints of head injury or neck injury. She does not remember how she fell. Reportedly the patient was walking in the house and one of the caregiver was present for thudd and found the patient on the ground. The patient was lying down on the right side of the hip and was awake and not lost any consciousness. Patient was complaining of pain on her right hip and therefore caregiver called EMS. Patient was recently started on Nuedexta by her PCP 4-6 weeks ago. No other new medications. At her baseline the patient has general complain of dizziness on ambulation and being unsteady. Patient does not have any frequent falls. Patient at the time of my evaluation denies having any complaints of headache, dizziness, vision changes, speech difficulty, chest pain, abdominal pain, shortness of breath, nausea. The was no diarrhea no constipation no increased urinary frequency. Patient also denies having any focal deficit. No neck pain  The patient is coming from home.  At her baseline ambulates without support And is dependent for most of her ADL; does not manages her medication on her own.  Review of Systems: as mentioned in the history of present illness.  A comprehensive review of the other systems is negative.  Past Medical History  Diagnosis Date  . Macular degeneration of both eyes   . Hypertension   . High cholesterol   . Hip fracture (HCC)   . Ankle fracture    History reviewed. No pertinent past surgical  history. Social History:  reports that she has never smoked. She does not have any smokeless tobacco history on file. She reports that she does not drink alcohol or use illicit drugs.  Allergies  Allergen Reactions  . Sulfa Antibiotics Other (See Comments)    unknown    History reviewed. No pertinent family history.  Prior to Admission medications   Medication Sig Start Date End Date Taking? Authorizing Provider  Calcium Carb-Cholecalciferol (CALCIUM 600 + D PO) Take 1 tablet by mouth 2 (two) times daily.   Yes Historical Provider, MD  CRESTOR 20 MG tablet Take 1 tablet by mouth daily. 07/23/13  Yes Historical Provider, MD  folic acid (FOLVITE) 800 MCG tablet Take 800 mcg by mouth daily.   Yes Historical Provider, MD  multivitamin-lutein (OCUVITE-LUTEIN) CAPS capsule Take 1 capsule by mouth daily.   Yes Historical Provider, MD  NUEDEXTA 20-10 MG CAPS Take 1 capsule by mouth 2 (two) times daily. 03/29/15  Yes Historical Provider, MD  PRESCRIPTION MEDICATION Place 1 drop into both eyes every 6 (six) weeks. On the day of eye injections.   Yes Historical Provider, MD  valsartan (DIOVAN) 320 MG tablet Take 1 tablet by mouth daily. 07/23/13  Yes Historical Provider, MD    Physical Exam: Filed Vitals:   05/02/15 1914 05/02/15 1930 05/02/15 2000  BP: 135/60 126/57 105/55  Pulse: 60 63 62  Temp: 97.9 F (36.6 C)    TempSrc: Oral    Resp: 18    Height:  (1.6 m)    Weight: 56.7 kg (125 lb)  SpO2: 99% 100% 99%    General: Alert, Awake and Oriented to Person. Appear in mild distress Eyes: PERRL ENT: Oral Mucosa clear moist. Neck: no JVD Cardiovascular: S1 and S2 Present, no Murmur, Peripheral Pulses Present Respiratory: Bilateral Air entry equal and Decreased,  Clear to Auscultation, no Crackles, no wheezes Abdomen: Bowel Sound present, Soft and no tenderness Skin: no Rash Extremities: no Pedal edema, no calf tenderness Neurologic: Mental status AAOx1, speech normal, attention  normal,  Cranial Nerves PERRL, EOM normal and present,  Motor strength bilateral equal strength 5/5,  Sensation present to light touch, Cerebellar test normal finger nose finger.  Labs on Admission:  CBC:  Recent Labs Lab 05/02/15 2038  WBC 15.2*  NEUTROABS 12.7*  HGB 12.1  HCT 37.6  MCV 96.7  PLT 221    CMP  No results found for: NA, K, CL, CO2, GLUCOSE, BUN, CREATININE, CALCIUM, PROT, ALBUMIN, AST, ALT, ALKPHOS, BILITOT, GFRNONAA, GFRAA  No results for input(s): CKTOTAL, CKMB, CKMBINDEX, TROPONINI in the last 168 hours. BNP (last 3 results) No results for input(s): BNP in the last 8760 hours.  ProBNP (last 3 results) No results for input(s): PROBNP in the last 8760 hours.   Radiological Exams on Admission: Dg Knee 2 Views Right  05/02/2015  CLINICAL DATA:  Patient fell at home today, unwitnessed. Pain right knee with movement. EXAM: RIGHT KNEE - 1-2 VIEW COMPARISON:  None. FINDINGS: Tricompartment degenerative changes in the right knee. Prominent narrowing of the medial and lateral compartments with tricompartment osteophytosis. Articular cartilage calcification consistent with chondrocalcinosis. No evidence of acute fracture or dislocation. No focal bone lesion or bone destruction. No significant effusion. Vascular calcifications. IMPRESSION: Tricompartment degenerative changes in the right knee. No acute bony abnormalities. Electronically Signed   By: Burman Nieves M.D.   On: 05/02/2015 20:15   Dg Chest Port 1 View  05/02/2015  CLINICAL DATA:  81 year old female with a history of preop chest x-ray EXAM: PORTABLE CHEST 1 VIEW COMPARISON:  03/25/2004, 03/23/2004 FINDINGS: Cardiomediastinal silhouette likely unchanged, partially obscured by overlying lung and pleural disease. Persistent elevation of the left hemidiaphragm. Similar appearance of mixed interstitial and airspace disease bilaterally. No pneumothorax. No large pleural effusion. No displaced fracture. IMPRESSION:  Similar appearance of prior chest x-rays with persisting chronic interstitial and airspace opacities, likely a combination of atelectasis and scarring given the history, with no evidence of new lobar pneumonia. Signed, Yvone Neu. Loreta Ave, DO [facility] Electronically Signed   By: Gilmer Mor D.O.   On: 05/02/2015 21:11   Dg Hip Unilat With Pelvis 2-3 Views Right  05/02/2015  CLINICAL DATA:  Patient fell at home today, unwitnessed. Severe right hip pain. EXAM: DG HIP (WITH OR WITHOUT PELVIS) 2-3V RIGHT COMPARISON:  None. FINDINGS: Degenerative changes in the right hip. Abnormal rotation of the right hip. Cortical lucencies consistent with nondisplaced inter trochanteric fracture. Mild impaction of fracture fragments. No dislocation in the right hip joint. Degenerative changes also demonstrated in the lower lumbar spine and left hip. Two screws fix the left femoral neck. Pelvis appears intact. Visualize sacrum appears intact. SI joints and symphysis pubis are not displaced. IMPRESSION: Minimally displaced and impacted inter trochanteric fracture of the right hip. Electronically Signed   By: Burman Nieves M.D.   On: 05/02/2015 20:13   EKG: Independently reviewed. normal sinus rhythm, nonspecific ST and T waves changes.  Assessment/Plan 1. Fracture, intertrochanteric, right femur [facility]) Patient is presenting with complaints of a fall. The  fall was most likely mechanical. Chest x-ray is clear. Neurological examination is also unremarkable. EKG is also unremarkable. X-ray shows that the patient has a pelvic fracture. Orthopedic has been consulted and the patient will be followed by Dr. Eulah PontMurphy. [facility] request of Dr. Eulah PontMurphy.  2. Preoperative evaluation. A) Cardiac risk: Based on RCRI patient is a moderate risk for adverse Cardiac outcome from surgery due to her age. Recommend monitor on telemetry but  no further workup.  B) Pulmonary risk: Recommend optimization of lung function with use of flutter device and incentive spirometry. Good pulmunary toilet.  C) General risk: Avoid major fluctuation in blood pressure intra-op and post operatively. Minimal sedation and Narcotics.  3  Hypertension Currently holding ARB can resume after surgery.  4  High cholesterol Continuing ho Sarriame statins  5  Lactic acidosis 6  Leucocytosis 7  UTI (lower urinary tract infection) leukocytosis is most likely secondary to stress reaction. Equally lactic acidosis is also secondary to stress reaction. Currently I would give her 500 mL bolus and also continue her on 75 mL maintenance fluid. With increased WBC in the urine and unwitnessed fall and complains of chronic dizziness I would treat her with ceftriaxone for possible UTI. Follow the cultures.  recheck a lactic acid 1 AM.  Nutrition: Nothing by mouth except medication until evaluated by orthopedics Prophylaxis: SCD  Advance goals of care discussion: Full code as per my discussion with caregiver   Consults: orthopedics Dr. Eulah PontMurphy  Family Communication: family was present at bedside, opportunity was given to ask question and all questions were answered satisfactorily at the time of interview. Disposition: Admitted as inpatient, tele[facility] unit[facility] .  Author: Lynden OxfordPranav Hildagarde Holleran, MD [facility] Pager: [phone] 05/02/2015  If 7PM-7AM, please contact night-coverage [email or web address] Password [email or web address]

## 2015-05-03 ENCOUNTER — Inpatient Hospital Stay (HOSPITAL_COMMUNITY): Payer: Medicare Other

## 2015-05-03 ENCOUNTER — Inpatient Hospital Stay (HOSPITAL_COMMUNITY): Payer: Medicare Other | Admitting: Anesthesiology

## 2015-05-03 ENCOUNTER — Encounter (HOSPITAL_COMMUNITY)
Admission: EM | Disposition: A | Discharge status: Skilled Nursing Facility | Payer: Self-pay | Source: Home / Self Care | Attending: Internal Medicine

## 2015-05-03 ENCOUNTER — Encounter (HOSPITAL_COMMUNITY): Payer: Self-pay | Admitting: Certified Registered Nurse Anesthetist

## 2015-05-03 DIAGNOSIS — N390 Urinary tract infection, site not specified: Secondary | ICD-10-CM

## 2015-05-03 DIAGNOSIS — I10 Essential (primary) hypertension: Secondary | ICD-10-CM

## 2015-05-03 DIAGNOSIS — E7800 Pure hypercholesterolemia, unspecified: Secondary | ICD-10-CM

## 2015-05-03 DIAGNOSIS — S72141A Displaced intertrochanteric fracture of right femur, initial encounter for closed fracture: Secondary | ICD-10-CM

## 2015-05-03 HISTORY — PX: FEMUR IM NAIL: SHX1597

## 2015-05-03 LAB — CBC WITH DIFFERENTIAL/PLATELET
BASOS PCT: 0 %
Basophils Absolute: 0 10*3/uL (ref 0.0–0.1)
Eosinophils Absolute: 0 10*3/uL (ref 0.0–0.7)
Eosinophils Relative: 0 %
HEMATOCRIT: 34.8 % — AB (ref 36.0–46.0)
HEMOGLOBIN: 11.4 g/dL — AB (ref 12.0–15.0)
LYMPHS ABS: 1.2 10*3/uL (ref 0.7–4.0)
Lymphocytes Relative: 7 %
MCH: 31.5 pg (ref 26.0–34.0)
MCHC: 32.8 g/dL (ref 30.0–36.0)
MCV: 96.1 fL (ref 78.0–100.0)
MONOS PCT: 7 %
Monocytes Absolute: 1.2 10*3/uL — ABNORMAL HIGH (ref 0.1–1.0)
NEUTROS ABS: 14.1 10*3/uL — AB (ref 1.7–7.7)
NEUTROS PCT: 86 %
Platelets: 205 10*3/uL (ref 150–400)
RBC: 3.62 MIL/uL — AB (ref 3.87–5.11)
RDW: 13.2 % (ref 11.5–15.5)
WBC: 16.5 10*3/uL — AB (ref 4.0–10.5)

## 2015-05-03 LAB — COMPREHENSIVE METABOLIC PANEL
ALBUMIN: 3.2 g/dL — AB (ref 3.5–5.0)
ALK PHOS: 44 U/L (ref 38–126)
ALT: 28 U/L (ref 14–54)
ANION GAP: 13 (ref 5–15)
AST: 14 U/L — ABNORMAL LOW (ref 15–41)
BILIRUBIN TOTAL: 0.4 mg/dL (ref 0.3–1.2)
BUN: 11 mg/dL (ref 6–20)
CALCIUM: 8.7 mg/dL — AB (ref 8.9–10.3)
CO2: 26 mmol/L (ref 22–32)
CREATININE: 0.83 mg/dL (ref 0.44–1.00)
Chloride: 100 mmol/L — ABNORMAL LOW (ref 101–111)
GFR calc Af Amer: >60 mL/min (ref >60)
GFR calc non Af Amer: >60 mL/min (ref >60)
GLUCOSE: 156 mg/dL — AB (ref 65–99)
Potassium: 4.4 mmol/L (ref 3.5–5.1)
SODIUM: 139 mmol/L (ref 135–145)
TOTAL PROTEIN: 6.1 g/dL — AB (ref 6.5–8.1)

## 2015-05-03 LAB — SURGICAL PCR SCREEN
MRSA, PCR: NEGATIVE
STAPHYLOCOCCUS AUREUS: POSITIVE — AB

## 2015-05-03 LAB — LACTIC ACID, PLASMA: Lactic Acid, Venous: 1.2 mmol/L (ref 0.5–2.0)

## 2015-05-03 SURGERY — INSERTION, INTRAMEDULLARY ROD, FEMUR
Anesthesia: General | Site: Hip | Laterality: Right

## 2015-05-03 MED ORDER — ONDANSETRON HCL 4 MG PO TABS: 4.0000 mg | ORAL_TABLET | Freq: Four times a day (QID) | ORAL | Status: DC | PRN

## 2015-05-03 MED ORDER — ACETAMINOPHEN 650 MG RE SUPP: 650.0000 mg | Freq: Four times a day (QID) | RECTAL | Status: DC | PRN

## 2015-05-03 MED ORDER — ACETAMINOPHEN 325 MG PO TABS: 650.0000 mg | ORAL_TABLET | Freq: Four times a day (QID) | ORAL | Status: DC | PRN

## 2015-05-03 MED ORDER — POTASSIUM CHLORIDE IN NACL 20-0.45 MEQ/L-% IV SOLN
INTRAVENOUS | Status: DC
  Administered 2015-05-03: 10:00:00 via INTRAVENOUS
  Filled 2015-05-03 (×2): qty 1000

## 2015-05-03 MED ORDER — CHLORHEXIDINE GLUCONATE 4 % EX LIQD
60.0000 mL | Freq: Once | CUTANEOUS | Status: AC
  Administered 2015-05-03: 4 via TOPICAL
  Filled 2015-05-03: qty 60

## 2015-05-03 MED ORDER — ASPIRIN EC 325 MG PO TBEC
325.0000 mg | DELAYED_RELEASE_TABLET | Freq: Every day | ORAL | Status: DC
  Administered 2015-05-04 – 2015-05-05 (×2): 325 mg via ORAL
  Filled 2015-05-03 (×2): qty 1

## 2015-05-03 MED ORDER — LIDOCAINE HCL (CARDIAC) 20 MG/ML IV SOLN
INTRAVENOUS | Status: DC | PRN
  Administered 2015-05-03: 80 mg via INTRAVENOUS

## 2015-05-03 MED ORDER — HYDROCODONE-ACETAMINOPHEN 5-325 MG PO TABS: 1.0000 | ORAL_TABLET | Freq: Four times a day (QID) | ORAL | Status: DC | PRN

## 2015-05-03 MED ORDER — 0.9 % SODIUM CHLORIDE (POUR BTL) OPTIME
TOPICAL | Status: DC | PRN
  Administered 2015-05-03: 1000 mL

## 2015-05-03 MED ORDER — ONDANSETRON HCL 4 MG/2ML IJ SOLN
4.0000 mg | Freq: Four times a day (QID) | INTRAMUSCULAR | Status: DC | PRN
  Administered 2015-05-03: 4 mg via INTRAVENOUS
  Filled 2015-05-03: qty 2

## 2015-05-03 MED ORDER — FENTANYL CITRATE (PF) 250 MCG/5ML IJ SOLN
INTRAMUSCULAR | Status: AC
  Filled 2015-05-03: qty 5

## 2015-05-03 MED ORDER — EPHEDRINE SULFATE 50 MG/ML IJ SOLN
INTRAMUSCULAR | Status: DC | PRN
  Administered 2015-05-03: 10 mg via INTRAVENOUS

## 2015-05-03 MED ORDER — MUPIROCIN 2 % EX OINT
TOPICAL_OINTMENT | Freq: Two times a day (BID) | CUTANEOUS | Status: DC
Start: 2015-05-03 — End: 2015-05-05
  Administered 2015-05-03 – 2015-05-05 (×4): via NASAL
  Filled 2015-05-03 (×3): qty 22

## 2015-05-03 MED ORDER — CEFAZOLIN SODIUM-DEXTROSE 2-3 GM-% IV SOLR
2.0000 g | Freq: Four times a day (QID) | INTRAVENOUS | Status: DC
  Filled 2015-05-03 (×3): qty 50

## 2015-05-03 MED ORDER — FENTANYL CITRATE (PF) 100 MCG/2ML IJ SOLN: 25.0000 ug | INTRAMUSCULAR | Status: DC | PRN

## 2015-05-03 MED ORDER — LIDOCAINE HCL (CARDIAC) 20 MG/ML IV SOLN
INTRAVENOUS | Status: AC
  Filled 2015-05-03: qty 5

## 2015-05-03 MED ORDER — CEFAZOLIN SODIUM-DEXTROSE 2-3 GM-% IV SOLR
INTRAVENOUS | Status: AC
  Filled 2015-05-03: qty 50

## 2015-05-03 MED ORDER — MENTHOL 3 MG MT LOZG: 1.0000 | LOZENGE | OROMUCOSAL | Status: DC | PRN

## 2015-05-03 MED ORDER — ONDANSETRON HCL 4 MG/2ML IJ SOLN
INTRAMUSCULAR | Status: AC
  Filled 2015-05-03: qty 2

## 2015-05-03 MED ORDER — PHENOL 1.4 % MT LIQD: 1.0000 | OROMUCOSAL | Status: DC | PRN

## 2015-05-03 MED ORDER — FENTANYL CITRATE (PF) 100 MCG/2ML IJ SOLN
INTRAMUSCULAR | Status: DC | PRN
  Administered 2015-05-03: 100 ug via INTRAVENOUS
  Administered 2015-05-03: 50 ug via INTRAVENOUS

## 2015-05-03 MED ORDER — METOCLOPRAMIDE HCL 5 MG/ML IJ SOLN: 5.0000 mg | Freq: Three times a day (TID) | INTRAMUSCULAR | Status: DC | PRN

## 2015-05-03 MED ORDER — STERILE WATER FOR INJECTION IJ SOLN
INTRAMUSCULAR | Status: AC
  Filled 2015-05-03: qty 10

## 2015-05-03 MED ORDER — ASPIRIN 325 MG PO TABS: 325.0000 mg | ORAL_TABLET | Freq: Every day | ORAL | Status: DC

## 2015-05-03 MED ORDER — PROPOFOL 10 MG/ML IV BOLUS
INTRAVENOUS | Status: AC
  Filled 2015-05-03: qty 20

## 2015-05-03 MED ORDER — ONDANSETRON HCL 4 MG/2ML IJ SOLN
INTRAMUSCULAR | Status: DC | PRN
  Administered 2015-05-03: 4 mg via INTRAVENOUS

## 2015-05-03 MED ORDER — PROPOFOL 10 MG/ML IV BOLUS
INTRAVENOUS | Status: DC | PRN
  Administered 2015-05-03: 80 mg via INTRAVENOUS

## 2015-05-03 MED ORDER — SUCCINYLCHOLINE CHLORIDE 20 MG/ML IJ SOLN
INTRAMUSCULAR | Status: DC | PRN
  Administered 2015-05-03: 100 mg via INTRAVENOUS

## 2015-05-03 MED ORDER — CEFAZOLIN SODIUM-DEXTROSE 2-3 GM-% IV SOLR
2.0000 g | Freq: Four times a day (QID) | INTRAVENOUS | Status: AC
  Administered 2015-05-03 (×2): 2 g via INTRAVENOUS
  Filled 2015-05-03 (×2): qty 50

## 2015-05-03 MED ORDER — METOCLOPRAMIDE HCL 5 MG PO TABS: 5.0000 mg | ORAL_TABLET | Freq: Three times a day (TID) | ORAL | Status: DC | PRN

## 2015-05-03 MED ORDER — LACTATED RINGERS IV SOLN
INTRAVENOUS | Status: DC | PRN
  Administered 2015-05-03: 12:00:00 via INTRAVENOUS

## 2015-05-03 MED ORDER — EPHEDRINE SULFATE 50 MG/ML IJ SOLN
INTRAMUSCULAR | Status: AC
  Filled 2015-05-03: qty 1

## 2015-05-03 SURGICAL SUPPLY — 38 items
CLOSURE STERI-STRIP 1/2X4 (GAUZE/BANDAGES/DRESSINGS) ×1
CLSR STERI-STRIP ANTIMIC 1/2X4 (GAUZE/BANDAGES/DRESSINGS) ×2 IMPLANT
COVER PERINEAL POST (MISCELLANEOUS) ×3 IMPLANT
COVER SURGICAL LIGHT HANDLE (MISCELLANEOUS) ×1 IMPLANT
DRAPE STERI IOBAN 125X83 (DRAPES) ×3 IMPLANT
DRSG MEPILEX BORDER 4X4 (GAUZE/BANDAGES/DRESSINGS) ×6 IMPLANT
DURAPREP 26ML APPLICATOR (WOUND CARE) ×3 IMPLANT
ELECT REM PT RETURN 9FT ADLT (ELECTROSURGICAL)
ELECTRODE REM PT RTRN 9FT ADLT (ELECTROSURGICAL) ×1 IMPLANT
GLOVE BIO SURGEON STRL SZ7 (GLOVE) ×3 IMPLANT
GLOVE BIO SURGEON STRL SZ7.5 (GLOVE) ×3 IMPLANT
GLOVE BIOGEL PI IND STRL 7.0 (GLOVE) ×1 IMPLANT
GLOVE BIOGEL PI IND STRL 8 (GLOVE) ×1 IMPLANT
GLOVE BIOGEL PI INDICATOR 7.0 (GLOVE) ×2
GLOVE BIOGEL PI INDICATOR 8 (GLOVE) ×2
GOWN STRL REUS W/ TWL LRG LVL3 (GOWN DISPOSABLE) ×1 IMPLANT
GOWN STRL REUS W/TWL LRG LVL3 (GOWN DISPOSABLE) ×3
GUIDEROD T2 3X1000 (ROD) ×2 IMPLANT
K-WIRE  3.2X450M STR (WIRE) ×2
K-WIRE 3.2X450M STR (WIRE) ×1
KIT BASIN OR (CUSTOM PROCEDURE TRAY) ×3 IMPLANT
KIT ROOM TURNOVER OR (KITS) ×3 IMPLANT
KWIRE 3.2X450M STR (WIRE) IMPLANT
MANIFOLD NEPTUNE II (INSTRUMENTS) ×1 IMPLANT
NAIL GAMMA LG R 5TI 10X360X125 (Nail) ×2 IMPLANT
NS IRRIG 1000ML POUR BTL (IV SOLUTION) ×3 IMPLANT
PACK GENERAL/GYN (CUSTOM PROCEDURE TRAY) ×3 IMPLANT
PAD ARMBOARD 7.5X6 YLW CONV (MISCELLANEOUS) ×4 IMPLANT
SCREW LAG GAMMA 3 TI 10.5X85MM (Screw) ×2 IMPLANT
SUT MNCRL AB 4-0 PS2 18 (SUTURE) IMPLANT
SUT MON AB 2-0 CT1 36 (SUTURE) IMPLANT
SUT VIC AB 0 CT1 27 (SUTURE) ×3
SUT VIC AB 0 CT1 27XBRD ANBCTR (SUTURE) ×1 IMPLANT
SUT VIC AB 2-0 CT1 27 (SUTURE) ×3
SUT VIC AB 2-0 CT1 TAPERPNT 27 (SUTURE) IMPLANT
TOWEL OR 17X24 6PK STRL BLUE (TOWEL DISPOSABLE) ×3 IMPLANT
TOWEL OR 17X26 10 PK STRL BLUE (TOWEL DISPOSABLE) ×3 IMPLANT
WATER STERILE IRR 1000ML POUR (IV SOLUTION) ×1 IMPLANT

## 2015-05-03 NOTE — Consult Note (Signed)
ORTHOPAEDIC CONSULTATION  REQUESTING PHYSICIAN: Bonnielee Haff, MD  Chief Complaint: R hip pain  HPI: Alyssa Jacobson is a 81 y.o. female who complains of R hip pain after an unwitnessed fall at home yesterday.  The patient does not remember how she fell. Per a caretaker, the patient was walking in the house when the caretaker heard a thud and found the patient on the floor.  Patient denies LOC.  Not complaining of any head or neck pain.  Patient reports chronic dizziness and unsteadiness with ambulation.  Patient lives at home and ambulates without any assistive devices.   Past Medical History  Diagnosis Date  . Macular degeneration of both eyes   . Hypertension   . High cholesterol   . Hip fracture ([place])   . Ankle fracture    History reviewed. No pertinent past surgical history. Social History   Social History  . Marital Status: Widowed    Spouse Name: N/A  . Number of Children: N/A  . Years of Education: N/A   Social History Main Topics  . Smoking status: Never Smoker   . Smokeless tobacco: None  . Alcohol Use: No  . Drug Use: No  . Sexual Activity: Not Asked   Other Topics Concern  . None   Social History Narrative   History reviewed. No pertinent family history. Allergies  Allergen Reactions  . Sulfa Antibiotics Other (See Comments)    unknown   Prior to Admission medications   Medication Sig Start Date End Date Taking? Authorizing Provider  Calcium Carb-Cholecalciferol (CALCIUM 600 + D PO) Take 1 tablet by mouth 2 (two) times daily.   Yes Historical Provider, MD  CRESTOR 20 MG tablet Take 1 tablet by mouth daily. 07/23/13  Yes Historical Provider, MD  folic acid (FOLVITE) 355 MCG tablet Take 800 mcg by mouth daily.   Yes Historical Provider, MD  multivitamin-lutein (OCUVITE-LUTEIN) CAPS capsule Take 1 capsule by mouth daily.   Yes Historical Provider, MD  NUEDEXTA 20-10 MG CAPS Take 1 capsule by mouth 2 (two) times daily. 03/29/15  Yes Historical  Provider, MD  [place] 1 drop into both eyes every 6 (six) weeks. On the day of eye injections.   Yes Historical Provider, MD  valsartan (DIOVAN) 320 MG tablet Take 1 tablet by mouth daily. 07/23/13  Yes Historical Provider, MD   Dg Knee 2 Views Right  05/02/2015  CLINICAL DATA:  Patient fell at home today, unwitnessed. Pain right knee with movement. EXAM: RIGHT KNEE - 1-2 VIEW COMPARISON:  None. FINDINGS: Tricompartment degenerative changes in the right knee. Prominent narrowing of the medial and lateral compartments with tricompartment osteophytosis. Articular cartilage calcification consistent with chondrocalcinosis. No evidence of acute fracture or dislocation. No focal bone lesion or bone destruction. No significant effusion. Vascular calcifications. IMPRESSION: Tricompartment degenerative changes in the right knee. No acute bony abnormalities. Electronically Signed   By: Lucienne Capers M.D.   On: 05/02/2015 20:15   Dg Chest Port 1 View  05/02/2015  CLINICAL DATA:  81 year old female with a history of preop chest x-ray EXAM: PORTABLE CHEST 1 VIEW COMPARISON:  03/25/2004, 03/23/2004 FINDINGS: Cardiomediastinal silhouette likely unchanged, partially obscured by overlying lung and pleural disease. Persistent elevation of the left hemidiaphragm. Similar appearance of mixed interstitial and airspace disease bilaterally. No pneumothorax. No large pleural effusion. No displaced fracture. IMPRESSION: Similar appearance of prior chest x-rays with persisting chronic interstitial and airspace opacities, likely a combination of atelectasis and scarring given the  history, with no evidence of new lobar pneumonia. Signed, Dulcy Fanny. Earleen Newport, DO [facility] Electronically Signed   By: Corrie Mckusick D.O.   On: 05/02/2015 21:11   Dg Hip Unilat With Pelvis 2-3 Views Right  05/02/2015  CLINICAL DATA:  Patient fell at home today, unwitnessed.  Severe right hip pain. EXAM: DG HIP (WITH OR WITHOUT PELVIS) 2-3V RIGHT COMPARISON:  None. FINDINGS: Degenerative changes in the right hip. Abnormal rotation of the right hip. Cortical lucencies consistent with nondisplaced inter trochanteric fracture. Mild impaction of fracture fragments. No dislocation in the right hip joint. Degenerative changes also demonstrated in the lower lumbar spine and left hip. Two screws fix the left femoral neck. Pelvis appears intact. Visualize sacrum appears intact. SI joints and symphysis pubis are not displaced. IMPRESSION: Minimally displaced and impacted inter trochanteric fracture of the right hip. Electronically Signed   By: Lucienne Capers M.D.   On: 05/02/2015 20:13    Positive ROS: All other systems have been reviewed and were otherwise negative with the exception of those mentioned in the HPI and as above.  Labs cbc  Recent Labs  05/02/15 2038 05/03/15 0044  WBC 15.2* 16.5*  HGB 12.1 11.4*  HCT 37.6 34.8*  PLT 221 205    Labs inflam No results for input(s): CRP in the last 72 hours.  Invalid input(s): ESR  Labs coag  Recent Labs  05/02/15 2038  INR 1.17     Recent Labs  05/02/15 2038 05/03/15 0044  NA 137 139  K 4.1 4.4  CL 100* 100*  CO2 26 26  GLUCOSE 134* 156*  BUN 13 11  CREATININE 0.91 0.83  CALCIUM 9.1 8.7*    Physical Exam: Filed Vitals:   05/03/15 0348  BP: 120/57  Pulse: 67  Temp: 98.1 F (36.7 C)  Resp: 16   General: Alert, no acute distress Cardiovascular: No pedal edema Respiratory: No cyanosis, no use of accessory musculature GI: No organomegaly, abdomen is soft and non-tender Skin: No lesions in the area of chief complaint other than those listed below in MSK exam.  Neurologic: Sensation intact distally Psychiatric: Patient is competent for consent with normal mood and affect Lymphatic: No axillary or cervical lymphadenopathy  MUSCULOSKELETAL:  R hip has swelling and ecchymosis.  Tender to  palpation.  Pain with log roll of hip.  Sensation intact with 2+ distal pulses.  Other extremities are atraumatic with painless ROM and NVI.  Assessment: Minimally displaced intertrochanteric R hip fracture after a mechanical fall at home yesterday  Plan: Patient found to have a R hip fracture on imaging.  Recommending surgical correction to help with mobilization and pain control.  Risk/benefits of surgery discussed with the patient.  Patient is understanding and would like to proceed with surgery.  Patient will remain on bedrest till surgery.  She will be NPO.  Gae Dry, PA-C Cell [phone]   05/03/2015 7:20 AM

## 2015-05-03 NOTE — Op Note (Signed)
DATE OF SURGERY:  05/03/2015  TIME: 1:21 PM  PATIENT NAME:  Alyssa Jacobson  AGE: 81 y.o.  PRE-OPERATIVE DIAGNOSIS:  Fractured right hip  POST-OPERATIVE DIAGNOSIS:  SAME  PROCEDURE:  INTRAMEDULLARY (IM) NAIL FEMORAL  SURGEON:  Cha Gomillion D  ASSISTANT:  Janalee DaneBrittney Kelly, PA-C, She was present and scrubbed throughout the case, critical for completion in a timely fashion, and for retraction, instrumentation, and closure.   OPERATIVE IMPLANTS: Stryker Gamma Nail with distal interlock screw  PREOPERATIVE INDICATIONS:  Alyssa Parrotudrey R Pavon is a 81 y.o. year old who fell and suffered a hip fracture. She was brought into the ER and then admitted and optimized and then elected for surgical intervention.    The risks benefits and alternatives were discussed with the patient including but not limited to the risks of nonoperative treatment, versus surgical intervention including infection, bleeding, nerve injury, malunion, nonunion, hardware prominence, hardware failure, need for hardware removal, blood clots, cardiopulmonary complications, morbidity, mortality, among others, and they were willing to proceed.    OPERATIVE PROCEDURE:  The patient was brought to the operating room and placed in the supine position. General anesthesia was administered, with a foley. She was placed on the fracture table.  Closed reduction was performed under C-arm guidance. The length of the femur was also measured using fluoroscopy. Time out was then performed after sterile prep and drape. She received preoperative antibiotics.  Incision was made proximal to the greater trochanter. A guidewire was placed in the appropriate position. Confirmation was made on AP and lateral views. The above-named nail was opened. I opened the proximal femur with a reamer. I then placed the nail by hand easily down. I did not need to ream the femur.  Once the nail was completely seated, I placed a guidepin into the femoral head into  the center center position. I measured the length, and then reamed the lateral cortex and up into the head. I then placed the lag screw. Slight compression was applied. Anatomic fixation achieved. Bone quality was mediocre.  I then secured the proximal interlocking bolt, and took off a half a turn, and then removed the instruments, and took final C-arm pictures AP and lateral the entire length of the leg.   Anatomic reconstruction was achieved, and the wounds were irrigated copiously and closed with Vicryl followed by staples and sterile gauze for the skin. The patient was awakened and returned to PACU in stable and satisfactory condition. There no complications and the patient tolerated the procedure well.  She will be weightbearing as tolerated, and will be on chemical px for a period of four weeks after discharge.   Margarita Ranaimothy Lively Haberman, M.D.    This note was generated using a template and dragon dictation system. In light of that, I have reviewed the note and all aspects of it are applicable to this case. Any dictation errors are due to the computerized dictation system.

## 2015-05-03 NOTE — Discharge Instructions (Signed)

## 2015-05-03 NOTE — Progress Notes (Signed)
[facility] PROGRESS NOTE  Alyssa Jacobson UJW:[id number] DOB: [birth year] DOA: 05/02/2015  PCP: Alyssa Melena, MD  Brief HPI: 81 year old Caucasian female with past medical history of hypertension, dementia, presented after a fall at home resulting in fracture of the right hip.  Past medical history:  Past Medical History  Diagnosis Date  . Macular degeneration of both eyes   . Hypertension   . High cholesterol   . Hip fracture ([facility])   . Ankle fracture     Consultants: Orthopedics  Procedures: ORIF scheduled for today  Antibiotics: Ceftriaxone 11/1  Subjective: Patient is pleasantly confused. Her family is at bedside. She denies any complaints.  Objective: Vital Signs  Filed Vitals:   05/02/15 2030 05/02/15 2214 05/02/15 2311 05/03/15 0348  BP: 135/61 105/86 127/53 120/57  Pulse: 58 74 77 67  Temp:  98 F (36.7 C) 97.6 F (36.4 C) 98.1 F (36.7 C)  TempSrc:   Oral Oral  Resp: Height:    (1.6 m)   Weight:   59.784 kg (131 lb 12.8 oz)   SpO2: 100% 95% 93% 97%    Intake/Output Summary (Last 24 hours) at 05/03/15 1032 Last data filed at 05/03/15 5621  Gross per 24 hour  Intake 413.75 ml  Output    275 ml  Net 138.75 ml   Filed Weights   05/02/15 1914 05/02/15 2311  Weight: 56.7 kg (125 lb) 59.784 kg (131 lb 12.8 oz)    General appearance: alert, cooperative, appears stated age and no distress Resp: clear to auscultation bilaterally Cardio: regular rate and rhythm, S1, S2 normal, no murmur, click, rub or gallop GI: soft, non-tender; bowel sounds normal; no masses,  no organomegaly Extremities: extremities normal, atraumatic, no cyanosis or edema Neurologic: No focal deficits. Pleasantly confused.  Lab Results:  Basic Metabolic Panel:  Recent Labs Lab 05/02/15 2038 05/03/15 0044  NA 137 139  K 4.1 4.4  CL 100* 100*  CO2 26 26  GLUCOSE 134* 156*  BUN 13 11  CREATININE 0.91 0.83  CALCIUM 9.1 8.7*   Liver Function  Tests:  Recent Labs Lab 05/02/15 2038 05/03/15 0044  AST 25 14*  ALT 14 28  ALKPHOS 48 44  BILITOT 0.6 0.4  PROT 6.5 6.1*  ALBUMIN 3.7 3.2*   CBC:  Recent Labs Lab 05/02/15 2038 05/03/15 0044  WBC 15.2* 16.5*  NEUTROABS 12.7* 14.1*  HGB 12.1 11.4*  HCT 37.6 34.8*  MCV 96.7 96.1  PLT 221 205    Studies/Results: Dg Knee 2 Views Right  05/02/2015  CLINICAL DATA:  Patient fell at home today, unwitnessed. Pain right knee with movement. EXAM: RIGHT KNEE - 1-2 VIEW COMPARISON:  None. FINDINGS: Tricompartment degenerative changes in the right knee. Prominent narrowing of the medial and lateral compartments with tricompartment osteophytosis. Articular cartilage calcification consistent with chondrocalcinosis. No evidence of acute fracture or dislocation. No focal bone lesion or bone destruction. No significant effusion. Vascular calcifications. IMPRESSION: Tricompartment degenerative changes in the right knee. No acute bony abnormalities. Electronically Signed   By: Burman Nieves M.D.   On: 05/02/2015 20:15   Dg Chest Port 1 View  05/02/2015  CLINICAL DATA:  81 year old female with a history of preop chest x-ray EXAM: PORTABLE CHEST 1 VIEW COMPARISON:  03/25/2004, 03/23/2004 FINDINGS: Cardiomediastinal silhouette likely unchanged, partially obscured by overlying lung and pleural disease. Persistent elevation of the left hemidiaphragm. Similar appearance of mixed interstitial and airspace disease bilaterally. No pneumothorax. No large  pleural effusion. No displaced fracture. IMPRESSION: Similar appearance of prior chest x-rays with persisting chronic interstitial and airspace opacities, likely a combination of atelectasis and scarring given the history, with no evidence of new lobar pneumonia. Signed, Yvone NeuJaime S. Loreta AveWagner, DO [facility] Electronically Signed   By: Gilmer MorJaime  Wagner D.O.   On: 05/02/2015 21:11   Dg Hip Unilat With Pelvis  2-3 Views Right  05/02/2015  CLINICAL DATA:  Patient fell at home today, unwitnessed. Severe right hip pain. EXAM: DG HIP (WITH OR WITHOUT PELVIS) 2-3V RIGHT COMPARISON:  None. FINDINGS: Degenerative changes in the right hip. Abnormal rotation of the right hip. Cortical lucencies consistent with nondisplaced inter trochanteric fracture. Mild impaction of fracture fragments. No dislocation in the right hip joint. Degenerative changes also demonstrated in the lower lumbar spine and left hip. Two screws fix the left femoral neck. Pelvis appears intact. Visualize sacrum appears intact. SI joints and symphysis pubis are not displaced. IMPRESSION: Minimally displaced and impacted inter trochanteric fracture of the right hip. Electronically Signed   By: Burman NievesWilliam  Stevens M.D.   On: 05/02/2015 20:13    Medications:  Scheduled: . ceFAZolin      . [MAR Hold] cefTRIAXone (ROCEPHIN)  IV  1 g Intravenous Q24H  . [MAR Hold] Dextromethorphan-Quinidine  1 capsule Oral BID  . [MAR Hold] ferrous sulfate  325 mg Oral TID PC  . [MAR Hold] rosuvastatin  20 mg Oral q1800  . [MAR Hold] senna  1 tablet Oral Daily   Continuous: . 0.45 % NaCl with KCl 20 mEq / L 100 mL/hr at 05/03/15 0930   PRN:[MAR Hold] ALPRAZolam, [MAR Hold] HYDROcodone-acetaminophen, [MAR Hold] methocarbamol **OR** [MAR Hold] methocarbamol (ROBAXIN)  IV, [MAR Hold]  morphine injection  Assessment/Plan:  Principal Problem:   Fracture, intertrochanteric, right femur (HCC) Active Problems:   Hypertension   High cholesterol   Lactic acidosis   Leucocytosis   UTI (lower urinary tract infection)    Fracture right hip Patient to undergo surgery today. Please see H&P for comprehensive preoperative evaluation. Incentive spirometry postoperatively. She remains at high risk for delirium postoperatively. DVT prophylaxis per orthopedics.  History of essential hypertension Continue to monitor blood pressures closely. Currently holding her  ARB.  History of hypercholesterolemia Continue Crestor  Urinary tract infection UTI could've contributed to the fall. Await urine cultures. Continue ceftriaxone. She did have elevated lactic acid level. At initial presentation. Subsequently levels have been normal. Does have leukocytosis.  DVT Prophylaxis: Per orthopedics, postoperatively    Code Status: Full code  Family Communication: Discussed with her son and her sister  Disposition Plan: Await surgery.     LOS: 1 day   [facility] Pe[facility] Surgery CenterKRISHNAN,Adhya Cocco  [facility] Pager [phone] 05/03/2015, 10:32 AM  If 7PM-7AM, please contact night-coverage at [email or web address], password [email or web address]

## 2015-05-03 NOTE — Anesthesia Preprocedure Evaluation (Addendum)
Anesthesia Evaluation  Patient identified by MRN, date of birth, ID band Patient awake    Reviewed: Allergy & Precautions, NPO status , Patient's Chart, lab work & pertinent test results  Airway Mallampati: I  TM Distance: >3 FB Neck ROM: Full    Dental  (+) Missing, Dental Advisory Given   Pulmonary    breath sounds clear to auscultation       Cardiovascular hypertension, Pt. on medications  Rhythm:Regular Rate:Normal     Neuro/Psych    GI/Hepatic   Endo/Other    Renal/GU      Musculoskeletal   Abdominal   Peds  Hematology   Anesthesia Other Findings   Reproductive/Obstetrics                           Anesthesia Physical Anesthesia Plan  ASA: III  Anesthesia Plan: General   Post-op Pain Management:    Induction: Intravenous  Airway Management Planned: Oral ETT  Additional Equipment:   Intra-op Plan:   Post-operative Plan: Extubation in OR  Informed Consent: I have reviewed the patients History and Physical, chart, labs and discussed the procedure including the risks, benefits and alternatives for the proposed anesthesia with the patient or authorized representative who has indicated his/her understanding and acceptance.   Dental advisory given  Plan Discussed with: CRNA and Anesthesiologist  Anesthesia Plan Comments:         Anesthesia Quick Evaluation

## 2015-05-03 NOTE — Progress Notes (Signed)
Orthopedic Tech Progress Note Patient Details:  Alyssa Jacobson 10/19/1922 [id number] Pt unable to use trapeze bar patient helper;RN notified  Patient ID: Alyssa Jacobson, female   DOB: 03/25/1923, 81 y.o.   MRN: [id number]   Alyssa Jacobson, [name] 05/03/2015, 7:33 AM

## 2015-05-03 NOTE — Anesthesia Procedure Notes (Signed)
Procedure Name: Intubation Date/Time: 05/03/2015 12:43 PM Performed by: Sarita HaverFLOWERS, Alyssa Jacobson Pre-anesthesia Checklist: Patient identified, Timeout performed, Emergency Drugs available, Suction available and Patient being monitored Patient Re-evaluated:Patient Re-evaluated prior to inductionOxygen Delivery Method: Circle system utilized and Simple face mask Preoxygenation: Pre-oxygenation with 100% oxygen Intubation Type: IV induction Ventilation: Mask ventilation without difficulty Laryngoscope Size: Miller and 3 Grade View: Grade I Tube type: Oral Tube size: 7.0 mm Number of attempts: 1 Airway Equipment and Method: Patient positioned with wedge pillow and Stylet Placement Confirmation: ETT inserted through vocal cords under direct vision,  positive ETCO2 and breath sounds checked- equal and bilateral Secured at: 21 cm Tube secured with: Tape Dental Injury: Teeth and Oropharynx as per pre-operative assessment

## 2015-05-03 NOTE — Anesthesia Postprocedure Evaluation (Signed)
  Anesthesia Post-op Note  Patient: Alyssa Jacobson  Procedure(s) Performed: Procedure(s): INTRAMEDULLARY (IM) NAIL FEMORAL (Right)  Patient Location: PACU  Anesthesia Type:General  Level of Consciousness: awake  Airway and Oxygen Therapy: Patient Spontanous Breathing  Post-op Pain: mild  Post-op Assessment: Post-op Vital signs reviewed     RLE Motor Response: Responds to commands RLE Sensation: Full sensation      Post-op Vital Signs: Reviewed  Last Vitals:  Filed Vitals:   05/03/15 1430  BP: 137/54  Pulse: 74  Temp: 36.6 C  Resp: 18    Complications: No apparent anesthesia complications

## 2015-05-03 NOTE — Care Management Note (Signed)
Case Management Note  Patient Details  Name: Alyssa Jacobson MRN: [id number] Date of Birth: 05/20/1923  Subjective/Objective:                    Action/Plan:  S/p INTRAMEDULLARY (IM) NAIL FEMORAL will await PT/OT notes  Expected Discharge Date:  05/07/15               Expected Discharge Plan:     In-House Referral:     Discharge planning Services     Post Acute Care Choice:    Choice offered to:     DME Arranged:    DME Agency:     HH Arranged:    HH Agency:     Status of Service:  In process, will continue to follow  Medicare Important Message Given:    Date Medicare IM Given:    Medicare IM give by:    Date Additional Medicare IM Given:    Additional Medicare Important Message give by:     If discussed at Long Length of Stay Meetings, dates discussed:    Additional Comments:  Kingsley PlanWile, Norrin Shreffler Marie, RN 05/03/2015, 3:14 PM

## 2015-05-03 NOTE — Transfer of Care (Signed)
Immediate Anesthesia Transfer of Care Note  Patient: Alyssa Jacobson  Procedure(s) Performed: Procedure(s): INTRAMEDULLARY (IM) NAIL FEMORAL (Right)  Patient Location: PACU  Anesthesia Type:General  Level of Consciousness: awake and alert   Airway & Oxygen Therapy: Patient Spontanous Breathing and Patient connected to nasal cannula oxygen  Post-op Assessment: Report given to RN and Post -op Vital signs reviewed and stable  Post vital signs: Reviewed and stable  Last Vitals:  Filed Vitals:   05/03/15 1400  BP: 142/66  Pulse: 75  Temp: 37 C  Resp: 24    Complications: No apparent anesthesia complications

## 2015-05-04 ENCOUNTER — Inpatient Hospital Stay (HOSPITAL_COMMUNITY): Payer: Medicare Other

## 2015-05-04 ENCOUNTER — Encounter (HOSPITAL_COMMUNITY): Payer: Self-pay | Admitting: Orthopedic Surgery

## 2015-05-04 DIAGNOSIS — D72829 Elevated white blood cell count, unspecified: Secondary | ICD-10-CM

## 2015-05-04 DIAGNOSIS — D62 Acute posthemorrhagic anemia: Secondary | ICD-10-CM

## 2015-05-04 LAB — BASIC METABOLIC PANEL
Anion gap: 10 (ref 5–15)
BUN: 11 mg/dL (ref 6–20)
CALCIUM: 8.1 mg/dL — AB (ref 8.9–10.3)
CO2: 24 mmol/L (ref 22–32)
CREATININE: 0.97 mg/dL (ref 0.44–1.00)
Chloride: 102 mmol/L (ref 101–111)
GFR calc non Af Amer: 50 mL/min — ABNORMAL LOW (ref >60)
GFR, EST AFRICAN AMERICAN: 58 mL/min — AB (ref >60)
GLUCOSE: 120 mg/dL — AB (ref 65–99)
Potassium: 3.9 mmol/L (ref 3.5–5.1)
Sodium: 136 mmol/L (ref 135–145)

## 2015-05-04 LAB — CBC
HEMATOCRIT: 27.6 % — AB (ref 36.0–46.0)
Hemoglobin: 9.1 g/dL — ABNORMAL LOW (ref 12.0–15.0)
MCH: 31.7 pg (ref 26.0–34.0)
MCHC: 33 g/dL (ref 30.0–36.0)
MCV: 96.2 fL (ref 78.0–100.0)
Platelets: 169 10*3/uL (ref 150–400)
RBC: 2.87 MIL/uL — ABNORMAL LOW (ref 3.87–5.11)
RDW: 13.7 % (ref 11.5–15.5)
WBC: 16.9 10*3/uL — ABNORMAL HIGH (ref 4.0–10.5)

## 2015-05-04 LAB — URINE CULTURE: CULTURE: NO GROWTH

## 2015-05-04 MED ORDER — CEFPODOXIME PROXETIL 200 MG PO TABS
200.0000 mg | ORAL_TABLET | Freq: Two times a day (BID) | ORAL | Status: DC
  Administered 2015-05-04 – 2015-05-05 (×3): 200 mg via ORAL
  Filled 2015-05-04 (×4): qty 1

## 2015-05-04 NOTE — Progress Notes (Signed)
     Subjective:  POD#1 Im nail of R hip. Patient reports pain as mild to moderate.  Resting comfortably in bed this morning.  Will see how the patient mobilizes with PT but suspect may need SNF at discharge.   Objective:   VITALS:   Filed Vitals:   05/03/15 1430 05/03/15 1449 05/03/15 2206 05/04/15 0116  BP: 137/54 116/50 116/54 115/52  Pulse: 74 65 103 100  Temp: 97.9 F (36.6 C) 97.8 F (36.6 C) 99.1 F (37.3 C) 99.4 F (37.4 C)  TempSrc:  Oral Oral Oral  Resp: 18 18 18 18   Height:      Weight:      SpO2: 98% 98% 91% 91%    Neurologically intact ABD soft Neurovascular intact Sensation intact distally Intact pulses distally Dorsiflexion/Plantar flexion intact Incision: dressing C/D/I   Lab Results  Component Value Date   WBC 16.5* 05/03/2015   HGB 11.4* 05/03/2015   HCT 34.8* 05/03/2015   MCV 96.1 05/03/2015   PLT 205 05/03/2015   BMET    Component Value Date/Time   NA 139 05/03/2015 0044   K 4.4 05/03/2015 0044   CL 100* 05/03/2015 0044   CO2 26 05/03/2015 0044   GLUCOSE 156* 05/03/2015 0044   BUN 11 05/03/2015 0044   CREATININE 0.83 05/03/2015 0044   CALCIUM 8.7* 05/03/2015 0044   GFRNONAA >60 05/03/2015 0044   GFRAA >60 05/03/2015 0044     Assessment/Plan: 1 Day Post-Op   Principal Problem:   Fracture, intertrochanteric, right femur (HCC) Active Problems:   Hypertension   High cholesterol   Lactic acidosis   Leucocytosis   UTI (lower urinary tract infection)   Up with therapy WBAT in the RLE ASA 325mg  daily for DVT prophylaxis   Marlyce Mcdougald Marie 05/04/2015, 6:20 AM Cell [phone]

## 2015-05-04 NOTE — Evaluation (Signed)
Physical Therapy Evaluation Patient Details Name: Alyssa Jacobson MRN: [id number] DOB: [birth year] Today's Date: 05/04/2015   History of Present Illness  Pt admitted after a fall in which she fractured her R hip, now s/p IM nail. PMH: dementia, macular degeneration, HTN, hip fx, ankle fx.    Clinical Impression  Pt admitted with above diagnosis. Pt currently with functional limitations due to the deficits listed below (see PT Problem List). Pt will benefit from skilled PT to increase their independence and safety with mobility to allow discharge to the venue listed below.  Pt is pleasantly confused and cooperated well with all that was asked of her.  Due to cognitive impairments and need for +2 assistance level recommend short term SNF.  If possible, family would like [facility] due to a family member working nearby.     Follow Up Recommendations SNF    Equipment Recommendations       Recommendations for Other Services       Precautions / Restrictions Precautions Precautions: Fall Restrictions Weight Bearing Restrictions: Yes RLE Weight Bearing: Weight bearing as tolerated      Mobility  Bed Mobility Overal bed mobility: +2 for physical assistance;Needs Assistance Bed Mobility: Supine to Sit     Supine to sit: Total assist;+2 for physical assistance     General bed mobility comments: assist for LEs, trunk and to scoot hips to EOB  Transfers Overall transfer level: Needs assistance Equipment used: Rolling walker (2 wheeled) Transfers: Sit to/from [employer] Transfers Sit to Stand: +2 physical assistance;Max assist Stand pivot transfers: +2 physical assistance;Max assist       General transfer comment: verbal and physical cues for hand placement, assist to advance R foot and move walker  Ambulation/Gait                Stairs            Wheelchair Mobility    Modified Rankin (Stroke Patients Only)       Balance Overall balance  assessment: Needs assistance Sitting-balance support: Feet supported Sitting balance-Leahy Scale: Fair       Standing balance-Leahy Scale: Poor                               Pertinent Vitals/Pain Pain Assessment: Faces Faces Pain Scale: Hurts even more Pain Location: R hip with movement Pain Descriptors / Indicators: Grimacing;Guarding Pain Intervention(s): Limited activity within patient's tolerance;Monitored during session;Repositioned;Ice applied    Home Living Family/patient expects to be discharged to:: Skilled nursing facility                 Additional Comments: Pt lives alone with a 24 hour caregiver.    Prior Function Level of Independence: Needs assistance   Gait / Transfers Assistance Needed: ambulated without a device  ADL's / Homemaking Assistance Needed: self fed and dressed with set up, total assist for bathing, assist for IADL        Hand Dominance   Dominant Hand: Right    Extremity/Trunk Assessment   Upper Extremity Assessment: Defer to OT evaluation           Lower Extremity Assessment: Generalized weakness;RLE deficits/detail      Cervical / Trunk Assessment: Kyphotic  Communication   Communication: HOH  Cognition Arousal/Alertness: Awake/alert Behavior During Therapy: WFL for tasks assessed/performed Overall Cognitive Status: History of cognitive impairments - at baseline       Memory: Decreased short-term  memory              General Comments      Exercises        Assessment/Plan    PT Assessment Patient needs continued PT services  PT Diagnosis Difficulty walking   PT Problem List Decreased strength;Decreased range of motion;Decreased balance;Decreased mobility;Decreased knowledge of use of DME;Decreased cognition  PT Treatment Interventions Gait training;Functional mobility training;Therapeutic activities;Therapeutic exercise;Balance training   PT Goals (Current goals can be found in the Care Plan  section) Acute Rehab PT Goals Patient Stated Goal: familiy would [facility] PT Goal Formulation: With family Time For Goal Achievement: 05/11/15 Potential to Achieve Goals: Good    Frequency Min 3X/week   Barriers to discharge        Co-evaluation PT/OT/SLP Co-Evaluation/Treatment: Yes Reason for Co-Treatment: For patient/therapist safety;Necessary to address cognition/behavior during functional activity PT goals addressed during session: Mobility/safety with mobility;Proper use of DME OT goals addressed during session: ADL's and self-care       End of Session Equipment Utilized During Treatment: Gait belt Activity Tolerance: Patient limited by pain Patient left: in chair;with call bell/phone within reach;with chair alarm set Nurse Communication: Mobility status;Weight bearing status         Time: [id number] PT Time Calculation (min) (ACUTE ONLY): 18 min   Charges:   PT Evaluation $Initial PT Evaluation Tier I: 1 Procedure     PT G Codes:        Alyssa Jacobson 05/04/2015, 10:15 AM

## 2015-05-04 NOTE — Progress Notes (Signed)
[facility] PROGRESS NOTE  NELLE SAYED ZOX:[id number] DOB: [birth year] DOA: 05/02/2015  PCP: Dwana Melena, MD  Brief HPI: 81 year old Caucasian female with past medical history of hypertension, dementia, presented after a fall at home resulting in fracture of the right hip.  Past medical history:  Past Medical History  Diagnosis Date  . Macular degeneration of both eyes   . Hypertension   . High cholesterol   . Hip fracture (HCC)   . Ankle fracture     Consultants: Orthopedics  Procedures: ORIF of right hip 11/2  Antibiotics: Ceftriaxone 11/1--11/3 Vantin 11/3  Subjective: Patient is pleasantly confused. Denies any pain. Her family is at bedside. Apparently, she did have some pain early morning today, relieved with oral pain medications.   Objective: Vital Signs  Filed Vitals:   05/03/15 1449 05/03/15 2206 05/04/15 0116 05/04/15 0645  BP: 116/50 116/54 115/52 118/56  Pulse: 65 103 100 56  Temp: 97.8 F (36.6 C) 99.1 F (37.3 C) 99.4 F (37.4 C) 98.8 F (37.1 C)  TempSrc: Oral Oral Oral Oral  Resp: Height:      Weight:      SpO2: 98% 91% 91% 91%    Intake/Output Summary (Last 24 hours) at 05/04/15 0747 Last data filed at 05/04/15 0600  Gross per 24 hour  Intake   1130 ml  Output    665 ml  Net    465 ml   Filed Weights   05/02/15 1914 05/02/15 2311  Weight: 56.7 kg (125 lb) 59.784 kg (131 lb 12.8 oz)    General appearance: alert, cooperative, appears stated age and no distress Resp: clear to auscultation bilaterally Cardio: regular rate and rhythm, S1, S2 normal, no murmur, click, rub or gallop GI: soft, non-tender; bowel sounds normal; no masses,  no organomegaly Neurologic: No focal deficits. Pleasantly confused.  Lab Results:  Basic Metabolic Panel:  Recent Labs Lab 05/02/15 2038 05/03/15 0044 05/04/15 0301  NA 137 139 136  K 4.1 4.4 3.9  CL 100* 100* 102  CO2 GLUCOSE 134* 156* 120*  BUN CREATININE 0.91 0.83 0.97  CALCIUM 9.1 8.7* 8.1*   Liver Function Tests:  Recent Labs Lab 05/02/15 2038 05/03/15 0044  AST 25 14*  ALT 14 28  ALKPHOS 48 44  BILITOT 0.6 0.4  PROT 6.5 6.1*  ALBUMIN 3.7 3.2*   CBC:  Recent Labs Lab 05/02/15 2038 05/03/15 0044 05/04/15 0301  WBC 15.2* 16.5* 16.9*  NEUTROABS 12.7* 14.1*  --   HGB 12.1 11.4* 9.1*  HCT 37.6 34.8* 27.6*  MCV 96.7 96.1 96.2  PLT 221 205 169    Studies/Results: Dg Knee 2 Views Right  05/02/2015  CLINICAL DATA:  Patient fell at home today, unwitnessed. Pain right knee with movement. EXAM: RIGHT KNEE - 1-2 VIEW COMPARISON:  None. FINDINGS: Tricompartment degenerative changes in the right knee. Prominent narrowing of the medial and lateral compartments with tricompartment osteophytosis. Articular cartilage calcification consistent with chondrocalcinosis. No evidence of acute fracture or dislocation. No focal bone lesion or bone destruction. No significant effusion. Vascular calcifications. IMPRESSION: Tricompartment degenerative changes in the right knee. No acute bony abnormalities. Electronically Signed   By: Burman Nieves M.D.   On: 05/02/2015 20:15   Dg Chest Port 1 View  05/02/2015  CLINICAL DATA:  81 year old female with a history of preop chest x-ray EXAM: PORTABLE CHEST 1 VIEW COMPARISON:  03/25/2004, 03/23/2004 FINDINGS: Cardiomediastinal  silhouette likely unchanged, partially obscured by overlying lung and pleural disease. Persistent elevation of the left hemidiaphragm. Similar appearance of mixed interstitial and airspace disease bilaterally. No pneumothorax. No large pleural effusion. No displaced fracture. IMPRESSION: Similar appearance of prior chest x-rays with persisting chronic interstitial and airspace opacities, likely a combination of atelectasis and scarring given the history, with no evidence of new lobar pneumonia. Signed, Yvone Neu. Loreta Ave, DO [facility]  [facility] Electronically Signed   By: Gilmer Mor D.O.   On: 05/02/2015 21:11   Dg Hip Operative Unilat With Pelvis Right  05/03/2015  CLINICAL DATA:  Status post internal fixation of right hip fracture. EXAM: OPERATIVE right HIP (WITH PELVIS IF PERFORMED) 3 VIEWS TECHNIQUE: Fluoroscopic spot image(s) were submitted for interpretation post-operatively. FLUOROSCOPY TIME:  40 seconds. COMPARISON:  May 02, 2015. FINDINGS: Status post surgical internal fixation of intertrochanteric fracture of proximal right femur. Good alignment of fracture components is noted. IMPRESSION: Status post surgical internal fixation of proximal right femoral fracture. Electronically Signed   By: Lupita Raider, M.D.   On: 05/03/2015 13:54   Dg Hip Unilat With Pelvis 2-3 Views Right  05/02/2015  CLINICAL DATA:  Patient fell at home today, unwitnessed. Severe right hip pain. EXAM: DG HIP (WITH OR WITHOUT PELVIS) 2-3V RIGHT COMPARISON:  None. FINDINGS: Degenerative changes in the right hip. Abnormal rotation of the right hip. Cortical lucencies consistent with nondisplaced inter trochanteric fracture. Mild impaction of fracture fragments. No dislocation in the right hip joint. Degenerative changes also demonstrated in the lower lumbar spine and left hip. Two screws fix the left femoral neck. Pelvis appears intact. Visualize sacrum appears intact. SI joints and symphysis pubis are not displaced. IMPRESSION: Minimally displaced and impacted inter trochanteric fracture of the right hip. Electronically Signed   By: Burman Nieves M.D.   On: 05/02/2015 20:13    Medications:  Scheduled: . aspirin EC  325 mg Oral Q breakfast  . cefTRIAXone (ROCEPHIN)  IV  1 g Intravenous Q24H  . Dextromethorphan-Quinidine  1 capsule Oral BID  . ferrous sulfate  325 mg Oral TID PC  . mupirocin ointment   Nasal BID  . rosuvastatin  20 mg Oral q1800  . senna  1 tablet Oral Daily   Continuous:   ZOX:[id number] **OR**  acetaminophen, ALPRAZolam, HYDROcodone-acetaminophen, menthol-cetylpyridinium **OR** phenol, methocarbamol **OR** methocarbamol (ROBAXIN)  IV, metoCLOPramide **OR** metoCLOPramide (REGLAN) injection, morphine injection, ondansetron **OR** ondansetron (ZOFRAN) IV  Assessment/Plan:  Principal Problem:   Fracture, intertrochanteric, right femur (HCC) Active Problems:   Hypertension   High cholesterol   Lactic acidosis   Leucocytosis   UTI (lower urinary tract infection)    Fracture right hip Patient status post ORIF. Seems to be stable postoperatively. DVT prophylaxis per orthopedics.  Postoperative anemia Likely due to acute blood loss. Continue to monitor hemoglobin. No need for transfusion at this time.  History of essential hypertension Continue to monitor blood pressures closely. Blood pressure borderline low. She is asymptomatic. Currently holding her ARB.  History of hypercholesterolemia Continue Crestor  Urinary tract infection UTI could've contributed to the fall. Await urine cultures. Change to oral Vantin. She did have elevated lactic acid level at initial presentation. Subsequently levels have been normal. Does have leukocytosis. But she remains afebrile.  DVT Prophylaxis: Started on aspirin by orthopedics Code Status: Full code  Family Communication: Discussed with her son Disposition Plan: Wait PT and OT evaluation. Will most likely need SNF.     LOS: 2  days   [facility]RISHNAN,Alexiz Sustaita  [facility] Pager [phone] 05/04/2015, 7:47 AM  If 7PM-7AM, please contact night-coverage at [email or web address], password [email or web address]

## 2015-05-04 NOTE — Evaluation (Signed)
Occupational Therapy Evaluation Patient Details Name: Alyssa Jacobson MRN: [id number] DOB: 09/14/1922 Today's Date: 05/04/2015    History of Present Illness Pt admitted after a fall in which she fractured her R hip, now s/p IM nail. PMH: dementia, macular degeneration, HTN, hip fx, ankle fx.     Clinical Impression   Pt was assisted with ADL by a 24 hour caregiver prior to admission to varying degrees.  She ambulated in her home without a device.  Pt presents with R hip pain with movement, impaired balance, generalized weakness and decreased memory.  She requires +2 max to total assist for all mobility.  Pt will need SNF upon discharge, family is in agreement. Will defer OT to SNF.  Follow Up Recommendations  SNF;Supervision/Assistance - 24 hour    Equipment Recommendations       Recommendations for Other Services       Precautions / Restrictions Precautions Precautions: Fall Restrictions Weight Bearing Restrictions: Yes RLE Weight Bearing: Weight bearing as tolerated      Mobility Bed Mobility Overal bed mobility: +2 for physical assistance;Needs Assistance Bed Mobility: Supine to Sit     Supine to sit: Total assist;+2 for physical assistance     General bed mobility comments: assist for LEs, trunk and to scoot hips to EOB  Transfers Overall transfer level: Needs assistance Equipment used: Rolling walker (2 wheeled) Transfers: Sit to/from [employer]J'sStand;Stand Pivot Transfers Sit to Stand: +2 physical assistance;Max assist Stand pivot transfers: +2 physical assistance;Max assist       General transfer comment: verbal and physical cues for hand placement, assist to advance R foot and move walker    Balance Overall balance assessment: Needs assistance Sitting-balance support: Feet supported Sitting balance-Leahy Scale: Fair       Standing balance-Leahy Scale: Poor                              ADL Overall ADL's : Needs  assistance/impaired Eating/Feeding: Set up;Sitting   Grooming: Wash/dry hands;Wash/dry face;Set up;Sitting   Upper Body Bathing: Total assistance;Sitting   Lower Body Bathing: Total assistance;+2 for physical assistance;Sit to/from stand   Upper Body Dressing : Moderate assistance;Sitting   Lower Body Dressing: +2 for physical assistance;Total assistance;Sit to/from stand                       Vision     Perception     Praxis      Pertinent Vitals/Pain Pain Assessment: Faces Faces Pain Scale: Hurts even more Pain Location: R hip with movement Pain Descriptors / Indicators: Grimacing;Guarding Pain Intervention(s): Limited activity within patient's tolerance;Monitored during session;Repositioned;Ice applied     Hand Dominance Right   Extremity/Trunk Assessment Upper Extremity Assessment Upper Extremity Assessment: Generalized weakness   Lower Extremity Assessment Lower Extremity Assessment: Defer to PT evaluation   Cervical / Trunk Assessment Cervical / Trunk Assessment: Kyphotic   Communication Communication Communication: HOH   Cognition Arousal/Alertness: Awake/alert Behavior During Therapy: WFL for tasks assessed/performed Overall Cognitive Status: History of cognitive impairments - at baseline       Memory: Decreased short-term memory (immediate memory impairment)             General Comments       Exercises       Shoulder Instructions      Home Living Family/patient expects to be discharged to:: Skilled nursing facility  Additional Comments: Pt lives alone with a 24 hour caregiver.      Prior Functioning/Environment Level of Independence: Needs assistance  Gait / Transfers Assistance Needed: ambulated without a device ADL's / Homemaking Assistance Needed: self fed and dressed with set up, total assist for bathing, assist for IADL        OT Diagnosis: Generalized weakness;Cognitive  deficits;Acute pain;Disturbance of vision   OT Problem List:     OT Treatment/Interventions:      OT Goals(Current goals can be found in the care plan section) Acute Rehab OT Goals Patient Stated Goal: family wants rehab [facility] at [facility]  OT Frequency:     Barriers to D/C:            Co-evaluation PT/OT/SLP Co-Evaluation/Treatment: Yes Reason for Co-Treatment: For patient/therapist safety;Necessary to address cognition/behavior during functional activity   OT goals addressed during session: ADL's and self-care      End of Session Equipment Utilized During Treatment: Gait belt;Rolling walker Nurse Communication: Mobility status  Activity Tolerance: Patient limited by pain Patient left: in chair;with call bell/phone within reach;with chair alarm set   Time: [phone] OT Time Calculation (min): 18 min Charges:  OT General Charges $OT Visit: 1 Procedure OT Evaluation $Initial OT Evaluation Tier I: 1 Procedure G-Codes:    Evern Bio 05/04/2015, 10:07 AM  [phone]

## 2015-05-04 NOTE — Progress Notes (Signed)
Spoke with patient's son about patient's home dextromethorphan-quinidine (non-formulary item which we do not carry).  He did not bring the medication in because it was already in the patient's pill box and he did not know where the original bottle was.   Per son, medication is newer for patient and very expensive so it may not be continued long term. He verbalized agreement to discontinuing it from her inpatient medication list.  Medication was discontinued from inpatient list. Please contact pharmacy with any questions.   Kamyla Olejnik D. Adalae Baysinger, PharmD, BCPS Clinical Pharmacist 05/04/2015 12:22 PM

## 2015-05-05 LAB — BASIC METABOLIC PANEL
Anion gap: 7 (ref 5–15)
BUN: 15 mg/dL (ref 6–20)
CALCIUM: 8.1 mg/dL — AB (ref 8.9–10.3)
CO2: 29 mmol/L (ref 22–32)
CREATININE: 0.9 mg/dL (ref 0.44–1.00)
Chloride: 99 mmol/L — ABNORMAL LOW (ref 101–111)
GFR, EST NON AFRICAN AMERICAN: 55 mL/min — AB (ref >60)
Glucose, Bld: 107 mg/dL — ABNORMAL HIGH (ref 65–99)
Potassium: 4.9 mmol/L (ref 3.5–5.1)
SODIUM: 135 mmol/L (ref 135–145)

## 2015-05-05 LAB — CBC
HCT: 26.6 % — ABNORMAL LOW (ref 36.0–46.0)
Hemoglobin: 8.9 g/dL — ABNORMAL LOW (ref 12.0–15.0)
MCH: 31.8 pg (ref 26.0–34.0)
MCHC: 33.5 g/dL (ref 30.0–36.0)
MCV: 95 fL (ref 78.0–100.0)
PLATELETS: 151 10*3/uL (ref 150–400)
RBC: 2.8 MIL/uL — ABNORMAL LOW (ref 3.87–5.11)
RDW: 13.8 % (ref 11.5–15.5)
WBC: 11.8 10*3/uL — AB (ref 4.0–10.5)

## 2015-05-05 MED ORDER — FERROUS SULFATE 325 (65 FE) MG PO TABS: 325.0000 mg | ORAL_TABLET | Freq: Three times a day (TID) | ORAL | Status: DC

## 2015-05-05 MED ORDER — CEFPODOXIME PROXETIL 200 MG PO TABS: 200.0000 mg | ORAL_TABLET | Freq: Two times a day (BID) | ORAL | Status: DC

## 2015-05-05 MED ORDER — ALPRAZOLAM 0.25 MG PO TABS: 0.2500 mg | ORAL_TABLET | Freq: Two times a day (BID) | ORAL | Status: DC | PRN

## 2015-05-05 MED ORDER — ACETAMINOPHEN 325 MG PO TABS: 650.0000 mg | ORAL_TABLET | Freq: Four times a day (QID) | ORAL | Status: AC | PRN

## 2015-05-05 MED ORDER — SENNA 8.6 MG PO TABS: 1.0000 | ORAL_TABLET | Freq: Every day | ORAL | Status: DC

## 2015-05-05 NOTE — Progress Notes (Signed)
     Subjective:  POD#2 IM nail of R hip. Patient reports pain as mild to moderate.  Required assist X2 yesterday for mobilization.  PT recommending SNF at discharge.  Ok to discharge from ortho standpoint.  Objective:   VITALS:   Filed Vitals:   05/04/15 0645 05/04/15 0933 05/04/15 2105 05/05/15 0618  BP: 118/56 97/50 114/62 124/54  Pulse: 56 82 88 81  Temp: 98.8 F (37.1 C) 98.1 F (36.7 C) 98.3 F (36.8 C) 98.3 F (36.8 C)  TempSrc: Oral Oral Oral Oral  Resp: 18 16 16 16   Height:      Weight:      SpO2: 91% 93% 97% 91%    ABD soft Neurovascular intact Sensation intact distally Intact pulses distally Dorsiflexion/Plantar flexion intact Incision: dressing C/D/I   Lab Results  Component Value Date   WBC PENDING 05/05/2015   HGB 8.9* 05/05/2015   HCT 26.6* 05/05/2015   MCV 95.0 05/05/2015   PLT PENDING 05/05/2015   BMET    Component Value Date/Time   NA 135 05/05/2015 0420   K 4.9 05/05/2015 0420   CL 99* 05/05/2015 0420   CO2 29 05/05/2015 0420   GLUCOSE 107* 05/05/2015 0420   BUN 15 05/05/2015 0420   CREATININE 0.90 05/05/2015 0420   CALCIUM 8.1* 05/05/2015 0420   GFRNONAA 55* 05/05/2015 0420   GFRAA >60 05/05/2015 0420     Assessment/Plan: 2 Days Post-Op   Principal Problem:   Fracture, intertrochanteric, right femur (HCC) Active Problems:   Hypertension   High cholesterol   Lactic acidosis   Leucocytosis   UTI (lower urinary tract infection)   Up with therapy WBAT in the RLE ASA 325mg  daily for DVT prophylaxis Ok for discharge from ortho standpoint   Alyssa Jacobson Marie 05/05/2015, 7:30 AM Cell [phone]

## 2015-05-05 NOTE — Clinical Social Work Note (Signed)
Clinical Social Work Assessment  Patient Details  Name: Alyssa Jacobson MRN: [id number] Date of Birth: 03/04/1923  Date of referral:  05/05/15               Reason for consult:  Facility Placement                Permission sought to share information with:  Facility Medical sales representativeContact Representative, Family Supports Permission granted to share information::  Yes, Verbal Permission Granted  Name::     Alyssa Jacobson patient's son.  Agency::  SNF admissions  Relationship::     Contact Information:     Housing/Transportation Living arrangements for the past 2 months:  Skilled Nursing Facility Source of Information:  Patient, Adult Children Patient Interpreter Needed:  None Criminal Activity/Legal Involvement Pertinent to Current Situation/Hospitalization:  No - Comment as needed Significant Relationships:  Adult Children Lives with:  Self Do you feel safe going back to the place where you live?  Yes (Once patient has received some therapy, patient feels safe to return back home.) Need for family participation in patient care:  Yes (Comment) (Patient requests to have son help make decisions)  Care giving concerns:  Patient and family feel she needs some short term rehab in order to return back home.   Social Worker assessment / plan:  Patient is a 81 year old female who lives on her own.  Patient has caregivers who check on patient throughout the day, patient is alert and oriented x4 and pleasant to talk to.  Patient's family is at bedside, and supportive of her care.  Patient was explained about SNF placement process and how insurance pays for the short term rehab.  Patient expressed that she is motivated to work hard in order to return back home.  Patient and family did not have any other questions, and are in agreement to having patient go to SNF for short term rehab.  Patient and family did not have any other concerns or issues expressed about patient going to SNF then returning back  home.  Employment status:  Retired Database administratornsurance information:  Managed Medicare PT Recommendations:  Skilled Nursing Facility Information / Referral to community resources:     Patient/Family's Response to care: Patient and family agreeable to going to SNF for short term rehab.  Patient/Family's Understanding of and Emotional Response to Diagnosis, Current Treatment, and Prognosis:  Patient and family aware of diagnosis and prognosis of current treatment plan.  Emotional Assessment Appearance:  Appears stated age Attitude/Demeanor/Rapport:    Affect (typically observed):  Stable, Quiet, Pleasant Orientation:  Oriented to Self, Oriented to Place, Oriented to  Time, Oriented to Situation Alcohol / Substance use:  Not Applicable Psych involvement (Current and /or in the community):  No (Comment)  Discharge Needs  Concerns to be addressed:  No discharge needs identified Readmission within the last 30 days:  No Current discharge risk:  None Barriers to Discharge:  No Barriers Identified   Darleene Cleavernterhaus, Catlin Aycock R, LCSWA 05/05/2015, 12:57 PM

## 2015-05-05 NOTE — Clinical Social Work Note (Addendum)
CSW presented bed offers for patient, and [facility]  CSW to make arrangements to have patient transported via [facility].  Patient's family made aware, transportation has been scheduled, family are at bedside and made aware of discharge plan.  [facility]  Patient and family agreeable to plans will transport via ems RN to call report.  Ervin KnackEric R. Sylvia Helms, MSW, LCSWA [phone] 05/05/2015 11:59 AM

## 2015-05-05 NOTE — Discharge Summary (Signed)
[facility]  Physician Discharge Summary   Patient ID: Alyssa Jacobson MRN: [id number] DOB/AGE: 06-Jan-1923 81 y.o.  Admit date: 05/02/2015 Discharge date: 05/05/2015  PCP: Dwana Melena, MD  DISCHARGE DIAGNOSES:  Principal Problem:   Fracture, intertrochanteric, right femur (HCC) Active Problems:   Hypertension   High cholesterol   Lactic acidosis   Leucocytosis   UTI (lower urinary tract infection)   RECOMMENDATIONS FOR OUTPATIENT FOLLOW UP: 1. CBC and basic metabolic panel next week 2. Resume valsartan if blood pressure gets into hypertensive range.  DISCHARGE CONDITION: fair  Diet recommendation: Low sodium  Filed Weights   05/02/15 1914 05/02/15 2311  Weight: 56.7 kg (125 lb) 59.784 kg (131 lb 12.8 oz)    INITIAL HISTORY: 81 year old Caucasian female with past medical history of hypertension, dementia, presented after a fall at home resulting in fracture of the right hip.  Consultants: Orthopedics  Procedures: ORIF of right hip 11/2   HOSPITAL COURSE:   Fracture right hip Patient is status post ORIF. She has remained stable postoperatively. Seen by orthopedics this morning. Cleared for discharge to SNF today. Patient seen by physical and occupational therapy.  Postoperative anemia Likely due to acute blood loss. Hemoglobin has been stable. No need for transfusion at this time. Continue to monitor at SNF.   History of essential hypertension Blood pressure noted to be borderline low. She is asymptomatic. Currently holding her ARB. Continue to monitor blood pressures at skilled nursing facility. If blood pressure get into the hypertensive range, consideration can be given to reinitiating Diovan.  History of hypercholesterolemia Continue Crestor  Urinary tract infection UTI could've contributed to the fall. Urine cultures are unremarkable. She was changed over to Vantin from ceftriaxone yesterday. Continue for 4 more days. She did have elevated lactic  acid level at initial presentation. Subsequently levels have been normal. Does have leukocytosis. But she remains afebrile.  Patient is stable. No events overnight. Discussed with her family this morning. Social worker to pursue placement. Ready for discharge.  PERTINENT LABS:  The results of significant diagnostics from this hospitalization (including imaging, microbiology, ancillary and laboratory) are listed below for reference.    Microbiology: Recent Results (from the past 240 hour(s))  Surgical pcr screen     Status: Abnormal   Collection Time: 05/03/15 11:20 AM  Result Value Ref Range Status   MRSA, PCR NEGATIVE NEGATIVE Final   Staphylococcus aureus POSITIVE (A) NEGATIVE Final    Comment:        The Xpert SA Assay (FDA approved for NASAL specimens in patients over 71 years of age), is one component of a comprehensive surveillance program.  Test performance has been validated by [facility] for patients greater than or equal to 69 year old. It is not intended to diagnose infection nor to guide or monitor treatment.   Culture, Urine     Status: None   Collection Time: 05/03/15  4:27 PM  Result Value Ref Range Status   Specimen Description URINE, CATHETERIZED  Final   Special Requests NONE  Final   Culture NO GROWTH 1 DAY  Final   Report Status 05/04/2015 FINAL  Final     Labs: Basic Metabolic Panel:  Recent Labs Lab 05/02/15 2038 05/03/15 0044 05/04/15 0301 05/05/15 0420  NA 137 139 136 135  K 4.1 4.4 3.9 4.9  CL 100* 100* 102 99*  CO2 26 26 24 29   GLUCOSE 134* 156* 120* 107*  BUN 13 11 11 15   CREATININE 0.91 0.83 0.97  0.90  CALCIUM 9.1 8.7* 8.1* 8.1*   Liver Function Tests:  Recent Labs Lab 05/02/15 2038 05/03/15 0044  AST 25 14*  ALT 14 28  ALKPHOS 48 44  BILITOT 0.6 0.4  PROT 6.5 6.1*  ALBUMIN 3.7 3.2*   CBC:  Recent Labs Lab 05/02/15 2038 05/03/15 0044 05/04/15 0301 05/05/15 0420  WBC 15.2* 16.5* 16.9* 11.8*  NEUTROABS 12.7* 14.1*   --   --   HGB 12.1 11.4* 9.1* 8.9*  HCT 37.6 34.8* 27.6* 26.6*  MCV 96.7 96.1 96.2 95.0  PLT 221 205 169 151    IMAGING STUDIES Dg Knee 2 Views Right  05/02/2015  CLINICAL DATA:  Patient fell at home today, unwitnessed. Pain right knee with movement. EXAM: RIGHT KNEE - 1-2 VIEW COMPARISON:  None. FINDINGS: Tricompartment degenerative changes in the right knee. Prominent narrowing of the medial and lateral compartments with tricompartment osteophytosis. Articular cartilage calcification consistent with chondrocalcinosis. No evidence of acute fracture or dislocation. No focal bone lesion or bone destruction. No significant effusion. Vascular calcifications. IMPRESSION: Tricompartment degenerative changes in the right knee. No acute bony abnormalities. Electronically Signed   By: Burman NievesWilliam  Stevens M.D.   On: 05/02/2015 20:15   Dg Chest Port 1 View  05/02/2015  CLINICAL DATA:  81 year old female with a history of preop chest x-ray EXAM: PORTABLE CHEST 1 VIEW COMPARISON:  03/25/2004, 03/23/2004 FINDINGS: Cardiomediastinal silhouette likely unchanged, partially obscured by overlying lung and pleural disease. Persistent elevation of the left hemidiaphragm. Similar appearance of mixed interstitial and airspace disease bilaterally. No pneumothorax. No large pleural effusion. No displaced fracture. IMPRESSION: Similar appearance of prior chest x-rays with persisting chronic interstitial and airspace opacities, likely a combination of atelectasis and scarring given the history, with no evidence of new lobar pneumonia. Signed, Yvone NeuJaime S. Loreta AveWagner, DO [facility] Electronically Signed   By: Gilmer MorJaime  Wagner D.O.   On: 05/02/2015 21:11   Dg Hip Operative Unilat With Pelvis Right  05/03/2015  CLINICAL DATA:  Status post internal fixation of right hip fracture. EXAM: OPERATIVE right HIP (WITH PELVIS IF PERFORMED) 3 VIEWS TECHNIQUE: Fluoroscopic spot image(s) were  submitted for interpretation post-operatively. FLUOROSCOPY TIME:  40 seconds. COMPARISON:  May 02, 2015. FINDINGS: Status post surgical internal fixation of intertrochanteric fracture of proximal right femur. Good alignment of fracture components is noted. IMPRESSION: Status post surgical internal fixation of proximal right femoral fracture. Electronically Signed   By: Lupita RaiderJames  Green Jr, M.D.   On: 05/03/2015 13:54   Dg Hip Unilat With Pelvis 2-3 Views Right  05/02/2015  CLINICAL DATA:  Patient fell at home today, unwitnessed. Severe right hip pain. EXAM: DG HIP (WITH OR WITHOUT PELVIS) 2-3V RIGHT COMPARISON:  None. FINDINGS: Degenerative changes in the right hip. Abnormal rotation of the right hip. Cortical lucencies consistent with nondisplaced inter trochanteric fracture. Mild impaction of fracture fragments. No dislocation in the right hip joint. Degenerative changes also demonstrated in the lower lumbar spine and left hip. Two screws fix the left femoral neck. Pelvis appears intact. Visualize sacrum appears intact. SI joints and symphysis pubis are not displaced. IMPRESSION: Minimally displaced and impacted inter trochanteric fracture of the right hip. Electronically Signed   By: Burman NievesWilliam  Stevens M.D.   On: 05/02/2015 20:13    DISCHARGE EXAMINATION: Filed Vitals:   05/04/15 0645 05/04/15 0933 05/04/15 2105 05/05/15 0618  BP: 118/56 97/50 114/62 124/54  Pulse: 56 82 88 81  Temp: 98.8 F (37.1 C) 98.1 F (36.7 C)  98.3 F (36.8 C) 98.3 F (36.8 C)  TempSrc: Oral Oral Oral Oral  Resp: Height:      Weight:      SpO2: 91% 93% 97% 91%   General appearance: alert, cooperative, appears stated age and no distress Resp: clear to auscultation bilaterally Cardio: regular rate and rhythm, S1, S2 normal, no murmur, click, rub or gallop GI: soft, non-tender; bowel sounds normal; no masses,  no organomegaly Extremities: No bruising noted on the right thigh area.   DISPOSITION:  SNF  Discharge Instructions    Call MD for:  difficulty breathing, headache or visual disturbances    Complete by:  As directed      Call MD for:  extreme fatigue    Complete by:  As directed      Call MD for:  persistant dizziness or light-headedness    Complete by:  As directed      Call MD for:  persistant nausea and vomiting    Complete by:  As directed      Call MD for:  redness, tenderness, or signs of infection (pain, swelling, redness, odor or green/yellow discharge around incision site)    Complete by:  As directed      Call MD for:  severe uncontrolled pain    Complete by:  As directed      Call MD for:  temperature >100.4    Complete by:  As directed      Diet - low sodium heart healthy    Complete by:  As directed      Discharge instructions    Complete by:  As directed   CBC and BMET on Monday. Resume Valsartan if BP starts getting into the hypertensive range.  You were cared for by a hospitalist during your hospital stay. If you have any questions about your discharge medications or the care you received while you were in the hospital after you are discharged, you can call the unit and asked to speak with the hospitalist on call if the hospitalist that took care of you is not available. Once you are discharged, your primary care physician will handle any further medical issues. Please note that NO REFILLS for any discharge medications will be authorized once you are discharged, as it is imperative that you return to your primary care physician (or establish a relationship with a primary care physician if you do not have one) for your aftercare needs so that they can reassess your need for medications and monitor your lab values. If you do not have a primary care physician, you can call [phone] for a physician referral.     Increase activity slowly    Complete by:  As directed      Weight bearing as tolerated    Complete by:  As directed   Laterality:  right  Extremity:   Lower           ALLERGIES:  Allergies  Allergen Reactions  . Sulfa Antibiotics Other (See Comments)    unknown     Current Discharge Medication List    START taking these medications   Details  acetaminophen (TYLENOL) 325 MG tablet Take 2 tablets (650 mg total) by mouth every 6 (six) hours as needed for mild pain (or Fever >/= 101).    ALPRAZolam (XANAX) 0.25 MG tablet Take 1 tablet (0.25 mg total) by mouth 2 (two) times daily as needed for anxiety. Qty: 30 tablet, Refills: 0  aspirin 325 MG tablet Take 1 tablet (325 mg total) by mouth daily. Qty: 30 tablet, Refills: 0    cefpodoxime (VANTIN) 200 MG tablet Take 1 tablet (200 mg total) by mouth every 12 (twelve) hours. For 4 more days    ferrous sulfate 325 (65 FE) MG tablet Take 1 tablet (325 mg total) by mouth 3 (three) times daily after meals. Refills: 3    HYDROcodone-acetaminophen (NORCO) 5-325 MG tablet Take 1-2 tablets by mouth every 6 (six) hours as needed for moderate pain. Qty: 90 tablet, Refills: 0    senna (SENOKOT) 8.6 MG TABS tablet Take 1 tablet (8.6 mg total) by mouth daily. Qty: 120 each, Refills: 0      CONTINUE these medications which have NOT CHANGED   Details  Calcium Carb-Cholecalciferol (CALCIUM 600 + D PO) Take 1 tablet by mouth 2 (two) times daily.    CRESTOR 20 MG tablet Take 1 tablet by mouth daily.    folic acid (FOLVITE) 800 MCG tablet Take 800 mcg by mouth daily.    multivitamin-lutein (OCUVITE-LUTEIN) CAPS capsule Take 1 capsule by mouth daily.    NUEDEXTA 20-10 MG CAPS Take 1 capsule by mouth 2 (two) times daily. Refills: 1    PRESCRIPTION MEDICATION Place 1 drop into both eyes every 6 (six) weeks. On the day of eye injections.      STOP taking these medications     valsartan (DIOVAN) 320 MG tablet        Follow-up Information    Follow up with MURPHY, TIMOTHY D, MD In 10 days.   Specialty:  Orthopedic Surgery   Contact information:   [address] [place] [address] [phone]       Follow up with Dwana Melena, MD. Schedule an appointment as soon as possible for a visit in 3 weeks.   Specialty:  Internal Medicine   Why:  post hospitalization follow up   Contact information:   [address] [place] [address] [phone]       TOTAL DISCHARGE TIME: 35 minutes  [employer]  [facility] Pager [phone]  05/05/2015, 10:46 AM

## 2015-05-05 NOTE — [place] FL2 (Signed)
[place] MEDICAID FL2 LEVEL OF CARE SCREENING TOOL     IDENTIFICATION  Patient Name: Alyssa Jacobson Birthdate: 11/24/1922 Sex: female Admission Date (Current Location): 05/02/2015  [facility] [place] [employer]    Facility and Address:  [facility], [address], [place], [place] [address]      Provider Number: [id number]  Attending Physician Name and Address:  Osvaldo ShipperGokul Krishnan, MD  Relative Name and Phone Number:  [name] [phone]    Current Level of Care: Hospital Recommended Level of Care: Skilled Nursing Facility Prior Approval Number:    Date Approved/Denied:   PASRR Number: [id number]  Discharge Plan: SNF    Current Diagnoses: Patient Active Problem List   Diagnosis Date Noted  . Fracture, intertrochanteric, right femur (HCC) 05/02/2015  . Lactic acidosis 05/02/2015  . Leucocytosis 05/02/2015  . UTI (lower urinary tract infection) 05/02/2015  . Hypertension   . High cholesterol     Orientation ACTIVITIES/SOCIAL BLADDER RESPIRATION         Continent Normal  BEHAVIORAL SYMPTOMS/MOOD NEUROLOGICAL BOWEL NUTRITION STATUS      Continent Diet (Regular Diet)  PHYSICIAN VISITS COMMUNICATION OF NEEDS Height & Weight Skin    Verbally 5\' 3"  (160 cm) 131 lbs. Surgical wounds          AMBULATORY STATUS RESPIRATION      Normal      Personal Care Assistance Level of Assistance               Functional Limitations Info                SPECIAL CARE FACTORS FREQUENCY                      Additional Factors Info  Code Status Code Status Info: Full Code             Current Medications (05/05/2015): Current Facility-Administered Medications  Medication Dose Route Frequency Provider Last Rate Last Dose  . acetaminophen (TYLENOL) tablet 650 mg  650 mg Oral Q6H PRN Janalee DaneBrittney Kelly, PA-C       Or  . acetaminophen (TYLENOL) suppository 650 mg  650 mg Rectal Q6H PRN Janalee DaneBrittney Kelly, PA-C      .  ALPRAZolam Prudy Feeler(XANAX) tablet 0.25 mg  0.25 mg Oral BID PRN Rolly SalterPranav M Patel, MD      . aspirin EC tablet 325 mg  325 mg Oral Q breakfast Janalee DaneBrittney Kelly, PA-C   325 mg at 05/05/15 0825  . cefpodoxime (VANTIN) tablet 200 mg  200 mg Oral Q12H Osvaldo ShipperGokul Krishnan, MD   200 mg at 05/04/15 2203  . ferrous sulfate tablet 325 mg  325 mg Oral TID PC Rolly SalterPranav M Patel, MD   325 mg at 05/05/15 0825  . HYDROcodone-acetaminophen (NORCO/VICODIN) 5-325 MG per tablet 1-2 tablet  1-2 tablet Oral Q6H PRN Rolly SalterPranav M Patel, MD   2 tablet at 05/05/15 0824  . menthol-cetylpyridinium (CEPACOL) lozenge 3 mg  1 lozenge Oral PRN Janalee DaneBrittney Kelly, PA-C       Or  . phenol (CHLORASEPTIC) mouth spray 1 spray  1 spray Mouth/Throat PRN Brittney Kelly, PA-C      . methocarbamol (ROBAXIN) tablet 500 mg  500 mg Oral Q6H PRN Rolly SalterPranav M Patel, MD   500 mg at 05/03/15 1801   Or  . methocarbamol (ROBAXIN) 500 mg in dextrose 5 % 50 mL IVPB  500 mg Intravenous Q6H PRN Rolly SalterPranav M Patel, MD      .  metoCLOPramide (REGLAN) tablet 5-10 mg  5-10 mg Oral Q8H PRN Janalee Dane, PA-C       Or  . metoCLOPramide (REGLAN) injection 5-10 mg  5-10 mg Intravenous Q8H PRN Janalee Dane, PA-C      . morphine 2 MG/ML injection 2 mg  2 mg Intravenous Q4H PRN Rolly Salter, MD   2 mg at 05/03/15 1526  . mupirocin ointment (BACTROBAN) 2 %   Nasal BID Osvaldo Shipper, MD      . ondansetron [facility]) tablet 4 mg  4 mg Oral Q6H PRN Janalee Dane, PA-C       Or  . ondansetron (ZOFRAN) injection 4 mg  4 mg Intravenous Q6H PRN Janalee Dane, PA-C   4 mg at 05/03/15 1801  . rosuvastatin (CRESTOR) tablet 20 mg  20 mg Oral q1800 Rolly Salter, MD   20 mg at 05/04/15 1852  . senna (SENOKOT) tablet 8.6 mg  1 tablet Oral Daily Rolly Salter, MD   8.6 mg at 05/04/15 4098   Do not use this list as official medication orders. Please verify with discharge summary.  Discharge Medications:   Medication List    STOP taking these medications        valsartan 320 MG tablet  Commonly  known as:  DIOVAN      TAKE these medications        acetaminophen 325 MG tablet  Commonly known as:  TYLENOL  Take 2 tablets (650 mg total) by mouth every 6 (six) hours as needed for mild pain (or Fever >/= 101).     ALPRAZolam 0.25 MG tablet  Commonly known as:  XANAX  Take 1 tablet (0.25 mg total) by mouth 2 (two) times daily as needed for anxiety.     aspirin 325 MG tablet  Take 1 tablet (325 mg total) by mouth daily.     CALCIUM 600 + D PO  Take 1 tablet by mouth 2 (two) times daily.     cefpodoxime 200 MG tablet  Commonly known as:  VANTIN  Take 1 tablet (200 mg total) by mouth every 12 (twelve) hours. For 4 more days     CRESTOR 20 MG tablet  Generic drug:  rosuvastatin  Take 1 tablet by mouth daily.     ferrous sulfate 325 (65 FE) MG tablet  Take 1 tablet (325 mg total) by mouth 3 (three) times daily after meals.     folic acid 800 MCG tablet  Commonly known as:  FOLVITE  Take 800 mcg by mouth daily.     HYDROcodone-acetaminophen 5-325 MG tablet  Commonly known as:  NORCO  Take 1-2 tablets by mouth every 6 (six) hours as needed for moderate pain.     multivitamin-lutein Caps capsule  Take 1 capsule by mouth daily.     NUEDEXTA 20-10 MG Caps  Generic drug:  Dextromethorphan-Quinidine  Take 1 capsule by mouth 2 (two) times daily.     PRESCRIPTION MEDICATION  Place 1 drop into both eyes every 6 (six) weeks. On the day of eye injections.     senna 8.6 MG Tabs tablet  Commonly known as:  SENOKOT  Take 1 tablet (8.6 mg total) by mouth daily.        Relevant Imaging Results:  Relevant Lab Results:  Recent Labs    Additional Information Allergies SULFA ANTIBIOTICS  Shondrika Hoque, Ervin Knack, LCSWA

## 2015-05-05 NOTE — Discharge Planning (Signed)
Ready for dc to SNF to continue rehab. Activity is up to chair with max assist on this 2nd post op day. Tolerating diet  And voiding without difficulty. Good pain relief with Vicodin 2. Will dc to SNF via [facility].

## 2015-05-05 NOTE — Clinical Social Work Note (Signed)
CSW received consult for patient needing SNF, CSW faxed out patient to SNFs.  CSW attempted to contact patient's son message left on voice mail awaiting call back.  CSW to continue to follow patient's progress.  Ervin KnackEric R. Berneda Piccininni, MSW, LCSWA [phone] 05/05/2015 9:00 AM

## 2015-05-05 NOTE — Care Management Important Message (Signed)
Important Message  Patient Details  Name: Alyssa Jacobson MRN: [id number] Date of Birth: 12/08/1922   Medicare Important Message Given:  Yes-second notification given    Orson AloeMegan P Twanisha Foulk 05/05/2015, 1:31 PM

## 2015-05-05 NOTE — Discharge Planning (Signed)
Ready for dc to SNF per PTAR. Dc'd with all personal belongings at 1445.

## 2015-05-16 ENCOUNTER — Encounter (INDEPENDENT_AMBULATORY_CARE_PROVIDER_SITE_OTHER): Payer: Medicare Other | Admitting: Ophthalmology

## 2015-07-14 ENCOUNTER — Encounter (HOSPITAL_COMMUNITY): Payer: Self-pay

## 2015-07-14 ENCOUNTER — Emergency Department (HOSPITAL_COMMUNITY)
Admission: EM | Admit: 2015-07-14 | Discharge: 2015-07-14 | Disposition: A | Discharge status: Home / Self Care | Payer: PPO | Attending: Emergency Medicine | Admitting: Emergency Medicine

## 2015-07-14 ENCOUNTER — Emergency Department (HOSPITAL_COMMUNITY): Payer: PPO

## 2015-07-14 DIAGNOSIS — Z8669 Personal history of other diseases of the nervous system and sense organs: Secondary | ICD-10-CM | POA: Diagnosis not present

## 2015-07-14 DIAGNOSIS — Y998 Other external cause status: Secondary | ICD-10-CM | POA: Insufficient documentation

## 2015-07-14 DIAGNOSIS — Y92009 Unspecified place in unspecified non-institutional (private) residence as the place of occurrence of the external cause: Secondary | ICD-10-CM | POA: Insufficient documentation

## 2015-07-14 DIAGNOSIS — I10 Essential (primary) hypertension: Secondary | ICD-10-CM | POA: Insufficient documentation

## 2015-07-14 DIAGNOSIS — W010XXA Fall on same level from slipping, tripping and stumbling without subsequent striking against object, initial encounter: Secondary | ICD-10-CM | POA: Insufficient documentation

## 2015-07-14 DIAGNOSIS — F419 Anxiety disorder, unspecified: Secondary | ICD-10-CM | POA: Insufficient documentation

## 2015-07-14 DIAGNOSIS — Z79899 Other long term (current) drug therapy: Secondary | ICD-10-CM | POA: Diagnosis not present

## 2015-07-14 DIAGNOSIS — E7800 Pure hypercholesterolemia, unspecified: Secondary | ICD-10-CM | POA: Diagnosis not present

## 2015-07-14 DIAGNOSIS — S8991XA Unspecified injury of right lower leg, initial encounter: Secondary | ICD-10-CM | POA: Insufficient documentation

## 2015-07-14 DIAGNOSIS — Z9889 Other specified postprocedural states: Secondary | ICD-10-CM | POA: Diagnosis not present

## 2015-07-14 DIAGNOSIS — Z7982 Long term (current) use of aspirin: Secondary | ICD-10-CM | POA: Insufficient documentation

## 2015-07-14 DIAGNOSIS — Y9389 Activity, other specified: Secondary | ICD-10-CM | POA: Insufficient documentation

## 2015-07-14 DIAGNOSIS — W19XXXA Unspecified fall, initial encounter: Secondary | ICD-10-CM

## 2015-07-14 DIAGNOSIS — Z8781 Personal history of (healed) traumatic fracture: Secondary | ICD-10-CM | POA: Insufficient documentation

## 2015-07-14 DIAGNOSIS — F0390 Unspecified dementia without behavioral disturbance: Secondary | ICD-10-CM | POA: Insufficient documentation

## 2015-07-14 DIAGNOSIS — S8992XA Unspecified injury of left lower leg, initial encounter: Secondary | ICD-10-CM | POA: Diagnosis not present

## 2015-07-14 MED ORDER — ACETAMINOPHEN 325 MG PO TABS
650.0000 mg | ORAL_TABLET | Freq: Once | ORAL | Status: AC
  Administered 2015-07-14: 650 mg via ORAL
  Filled 2015-07-14: qty 2

## 2015-07-14 MED ORDER — LORAZEPAM 2 MG/ML IJ SOLN
0.5000 mg | Freq: Once | INTRAMUSCULAR | Status: AC
  Administered 2015-07-14: 0.5 mg via INTRAMUSCULAR
  Filled 2015-07-14: qty 1

## 2015-07-14 MED ORDER — ALPRAZOLAM 0.25 MG PO TABS: 0.2500 mg | ORAL_TABLET | Freq: Two times a day (BID) | ORAL | Status: DC | PRN

## 2015-07-14 NOTE — Discharge Instructions (Signed)
As discussed, your evaluation today has been largely reassuring.  But, it is important that you monitor your condition carefully, and do not hesitate to return to the ED if you develop new, or concerning changes in your condition. ? ?Otherwise, please follow-up with your physician for appropriate ongoing care. ? ?

## 2015-07-14 NOTE — ED Notes (Signed)
Per family patient fell at home after tripping, hx of right hip fracture.

## 2015-07-14 NOTE — ED Provider Notes (Signed)
CSN: [id number]     Arrival date & time 07/14/15  1952 History   First MD Initiated Contact with Patient 07/14/15 2001     Chief Complaint  Patient presents with  . Fall     (Consider location/radiation/quality/duration/timing/severity/associated sxs/prior Treatment) HPI Patient resides with her daughter who assists with the history of present illness. Patient has dementia, level V caveat. Per report, the patient was in her usual state of health, having recovered from a fall with right hip fracture 4 months ago, when, just prior to arrival, the patient fell on a level surface. The patient was walking on gravel. No report of head trauma, loss of consciousness, change in interactivity since the event. However, the patient has been nonambulatory, with no attempts to stand since the event. The patient typically uses a walker, though she frequently forgets to use this. The patient described pain throughout the lower extremities, but moves both spontaneously. She denies head pain, denies other focal complaints. Daughter corroborates that the patient seems to be complaining primarily of pain in the lower extremities, right greater than left.  Past Medical History  Diagnosis Date  . Macular degeneration of both eyes   . Hypertension   . High cholesterol   . Hip fracture (HCC)   . Ankle fracture    Past Surgical History  Procedure Laterality Date  . Femur im nail Right 05/03/2015    Procedure: INTRAMEDULLARY (IM) NAIL FEMORAL;  Surgeon: Sheral Apleyimothy D Murphy, MD;  Location: [facility] OR;  Service: Orthopedics;  Laterality: Right;   History reviewed. No pertinent family history. Social History  Substance Use Topics  . Smoking status: Never Smoker   . Smokeless tobacco: None  . Alcohol Use: No   OB History    No data available     Review of Systems  Unable to perform ROS: Dementia      Allergies  Sulfa antibiotics  Home Medications   Prior to Admission medications   Medication Sig  Start Date End Date Taking? Authorizing Provider  acetaminophen (TYLENOL) 325 MG tablet Take 2 tablets (650 mg total) by mouth every 6 (six) hours as needed for mild pain (or Fever >/= 101). 05/05/15   Osvaldo ShipperGokul Krishnan, MD  ALPRAZolam Prudy Feeler(XANAX) 0.25 MG tablet Take 1 tablet (0.25 mg total) by mouth 2 (two) times daily as needed for anxiety. 05/05/15   Osvaldo ShipperGokul Krishnan, MD  aspirin 325 MG tablet Take 1 tablet (325 mg total) by mouth daily. 05/03/15   Brittney Tresa EndoKelly, PA-C  Calcium Carb-Cholecalciferol (CALCIUM 600 + D PO) Take 1 tablet by mouth 2 (two) times daily.    Historical Provider, MD  cefpodoxime (VANTIN) 200 MG tablet Take 1 tablet (200 mg total) by mouth every 12 (twelve) hours. For 4 more days 05/05/15   Osvaldo ShipperGokul Krishnan, MD  CRESTOR 20 MG tablet Take 1 tablet by mouth daily. 07/23/13   Historical Provider, MD  ferrous sulfate 325 (65 FE) MG tablet Take 1 tablet (325 mg total) by mouth 3 (three) times daily after meals. 05/05/15   Osvaldo ShipperGokul Krishnan, MD  folic acid (FOLVITE) 800 MCG tablet Take 800 mcg by mouth daily.    Historical Provider, MD  HYDROcodone-acetaminophen (NORCO) 5-325 MG tablet Take 1-2 tablets by mouth every 6 (six) hours as needed for moderate pain. 05/03/15   Brittney Tresa EndoKelly, PA-C  multivitamin-lutein (OCUVITE-LUTEIN) CAPS capsule Take 1 capsule by mouth daily.    Historical Provider, MD  NUEDEXTA 20-10 MG CAPS Take 1 capsule by mouth 2 (two) times daily. 03/29/15  Historical Provider, MD  PRESCRIPTION MEDICATION Place 1 drop into both eyes every 6 (six) weeks. On the day of eye injections.    Historical Provider, MD  senna (SENOKOT) 8.6 MG TABS tablet Take 1 tablet (8.6 mg total) by mouth daily. 05/05/15   Osvaldo Shipper, MD   BP 156/71 mmHg  Pulse 69  Temp(Src) 98.2 F (36.8 C) (Oral)  Resp 18  SpO2 99% Physical Exam  Constitutional: She appears well-developed and well-nourished. No distress.  HENT:  Head: Normocephalic and atraumatic.  Eyes: Conjunctivae and EOM are normal.   Cardiovascular: Normal rate and regular rhythm.   Pulmonary/Chest: Effort normal and breath sounds normal. No stridor. No respiratory distress.  Abdominal: She exhibits no distension.  Musculoskeletal: She exhibits no edema.  Pelvis is stable.  Patient can flex the left hip spontaneously, knee spontaneously, ankle unremarkably. The right hip is nontender to palpation, though there is hesitancy to move the hip or knee secondary to pain. No gross deformity of the right knee. Distal pulses appreciable in both legs.   Neurological: She is alert. She displays atrophy. She displays no tremor. No cranial nerve deficit or sensory deficit. She displays no seizure activity.  Patient moves all extremity spontaneously, though the right leg moves minimally.  Skin: Skin is warm and dry.  Psychiatric: Her mood appears anxious. Thought content is delusional. Cognition and memory are impaired.  Nursing note and vitals reviewed.   ED Course  Procedures (including critical care time)  Imaging Review Dg Knee Complete 4 Views Right  07/14/2015  CLINICAL DATA:  Pain following fall EXAM: RIGHT KNEE - COMPLETE 4+ VIEW COMPARISON:  May 02, 2015 FINDINGS: Frontal, lateral, and bilateral oblique views were obtained. There is rod fixation throughout much of the femur. There is no acute fracture or dislocation. There is a degree of medial patellar subluxation, however. There is no appreciable joint effusion. There is marked narrowing laterally. There is spurring in all compartments. No erosive change. There is arterial vascular calcification. IMPRESSION: There is a degree of medial patellar subluxation. No fracture or frank dislocation. No joint effusion. Osteoarthritic change, most marked laterally. Electronically Signed   By: Bretta Bang III M.D.   On: 07/14/2015 21:21   Dg Hips Bilat With Pelvis Min 5 Views  07/14/2015  CLINICAL DATA:  Fall in driveway tonight, pt complaining of butt pain and hip pain, pt  has dementia, right femur rod placed Sept 2016 per daughter EXAM: DG HIP (WITH OR WITHOUT PELVIS) 5+V BILAT COMPARISON:  None. FINDINGS: Internal fixation of the LEFT and RIGHT femur. No evidence acute fracture dislocation. No pelvic fracture or sacral fracture IMPRESSION: No evidence of acute hip fracture or pelvic fracture. Electronically Signed   By: Genevive Bi M.D.   On: 07/14/2015 21:19   I have personally reviewed and evaluated these images and lab results as part of my medical decision-making.   9:46 PM On repeat exam the patient is in no distress.  She now denies pain in the right leg, and with pressure applied to the right knee, there is no disruption of pain, discomfort. Low suspicion for occult fracture. I discussed all findings at length with patient's daughter, sister. Patient will follow-up with primary care.  MDM   Final diagnoses:  Fall   Elderly female dementia presents after mechanical fall. Here, the patient is interacting any baseline fashion. Patient does have pain in several areas, though this is a migratory complaint. X-rays reassuring. Vital signs reassuring. Patient discharged in stable  condition.   Gerhard Munch, MD 07/14/15 2147

## 2015-10-24 ENCOUNTER — Other Ambulatory Visit: Payer: Self-pay | Admitting: *Deleted

## 2015-10-24 NOTE — Patient Outreach (Signed)
[facility]) Care Management  10/24/2015  Alyssa Jacobson 04/04/1923 [id number]   Referral per MD office; reason-General health decline; Failure to Thrive:  Telephone call to contact person-Alyssa Jacobson [phone]); left message on voice mail requesting return call.  Plan: Will follow up. Colleen CanLinda Inayah Woodin, RN BSN CCM Care Management Coordinator [facility] Care [facility]  [phone]

## 2015-10-25 ENCOUNTER — Ambulatory Visit: Payer: Self-pay | Admitting: *Deleted

## 2015-10-26 ENCOUNTER — Other Ambulatory Visit: Payer: Self-pay | Admitting: *Deleted

## 2015-10-26 DIAGNOSIS — R627 Adult failure to thrive: Secondary | ICD-10-CM

## 2015-10-26 DIAGNOSIS — F03918 Unspecified dementia, unspecified severity, with other behavioral disturbance: Secondary | ICD-10-CM

## 2015-10-26 DIAGNOSIS — F0391 Unspecified dementia with behavioral disturbance: Secondary | ICD-10-CM

## 2015-10-26 NOTE — Patient Outreach (Signed)
[facility]) Care Management  10/26/2015  Alyssa Jacobson 01/01/1923 [id number]  Return voice message from contact who was returning my call: Left message requesting call back.  Subjective/objective: Received return call from caregiver/daughter /healthcare power of attorney/Ladora [phone]) who states that patient who is her mother has dementia and cannot speak for herself. States she is health care power of attorney and takes all of her calls.   Caregiver/daughter states patient's health is declining and currently she requires 24/7 attendance. States she has 3 paid caregivers and she stays with her to fill in the hours that other caregivers can't work. States she has ""sundowning" with the dementia. Has fallen 3 times this year without injury. States she uses walker but gait is unsteady and she has visual impairment due to macular degeneration.  States patient is able to go to bathroom on her own. States patient has had several urinary tract infections in the last 6 months with the most recent ones in March and April of this year.  States she was on antibiotics at that time.   Currently caregivers prepare patient's medications and she is able to swallow if put in applesauce. States appetite is better on some days than others. Diet is supplemented with boost twice daily. States current weight is 125 pounds but unable to give comparative weights.   States she takes patient to MD appointments which is difficult at times. Seeing primary care about every 3 months with last visit in March 2017. No problems getting medications (using [facility] who delivers).  States patient has long term care plan but has not been unable to access benefits yet. States she is looking at [facility] assisted living but not sure if they will be able to meet her needs. States she does want to keep at home as long as possible if able. States not familiar with caring for patient with progressive  dementia.    States patient had hip fracture in 05/2015 requiring surgery & skilled facility stay. Caregiver's major health concerns are patient's general health decline, frequent falls, sundowning, frequent urinary tract infections. States would like to have [facility] care management to do some health assessments on patient.  Assessment: Patient with general health decline, failure to thrive. Diagnoses-Dementia, HTN, hyperlipidemia, macular degeneration. High falls risk-3 falls -hx broken hip with ORIF 05/2015/uses walker  Has caregivers 24/7-  Appetite fair, frequent urinary tract infections Caregiver not familiar with caring for declining dementia patient.  Plan: Refer to care management assistant to assign to [facility] for complex case management & Clinical social worker for community resources.Colleen Can.  Tykia Mellone, RN BSN CCM Care Management Coordinator [facility] Care Management  [phone]

## 2015-10-30 ENCOUNTER — Encounter: Payer: Self-pay | Admitting: Licensed Clinical Social Worker

## 2015-10-30 ENCOUNTER — Other Ambulatory Visit: Payer: Self-pay | Admitting: Licensed Clinical Social Worker

## 2015-10-30 NOTE — Patient Outreach (Signed)
Assessment:  CSW received referral on Alyssa Jacobson. CSW completed chart review on client on 10/30/15. Client sees Dr. Nita SellsJohn Hall as primary care physician.  Client receives 24/7 care in her home.  Her daughter, Alyssa Jacobson is very supportive of client.   CSW called Alyssa Scarletorinda Jacobson, daughter of client, on 10/30/15 and spoke via phone with Alyssa Scarletorinda Jacobson. CSW verified identity of Alyssa Jacobson.  CSW received verbal permission from [place] Ridge[place]inda Jacobson on 10/30/15 for CSW to speak with Alyssa about client needs.   Client saw Dr. Margo AyeHall on 09/29/15 for medical appointment.  Alyssa Jacobson provides transport assistance for client to go to and from client medical appointments. Client has her prescription medications and is taking medications as prescribed. Client has [employer] insurance.  Client has a long term care insurance policy. Client needs maximum assistance for activities of daily living care.  Client has partial dentures.  Client has to have maximum assistance in standing from chair. Client uses a walker to help her ambulate. CSW and Alyssa spoke of client care plan. Alyssa agreed that a care plan goal for client would be that client attend all scheduled client medical appointments in the next 30 days.  Alyssa said that client has dementia and is often confused. Client needs encouragement to complete activities of daily living with maximum assistance/support needed.  CSW informed Alyssa that [facility] consent form would be mailed to client. CSW and Alyssa discussed [facility] consent form completion for client. CSW informed Alyssa that [facility] RN had been referred to provide nursing support for client.  Alyssa said she was glad RN had been referred and she looked forward to speaking with [facility] RN about nursing needs of client.  CSW and Alyssa completed needed [facility] assessments on client.  CSW thanked Alyssa Jacobson for phone call with CSW on 10/30/15. CSW gave Alyssa the [facility] CSW phone number of  [phone].   Plan:  Client to attend all scheduled client medical appointments in the next 30 days. CSW to call client/Alyssa Jacobson in two weeks to discuss needs of client at that time.  Kelton PillarMichael S.Vaeda Westall MSW, LCSW Licensed Clinical Social Worker [facility] Care Management [phone]

## 2015-10-31 ENCOUNTER — Other Ambulatory Visit: Payer: Self-pay | Admitting: *Deleted

## 2015-10-31 NOTE — Patient Outreach (Signed)
[facility]N) Care Management  10/31/2015  Alyssa Jacobson 11/28/1922 [id number]  Mrs. Alyssa Jacobson is a 82 year old lady with failure to thrive, dementia with behavioral disturbance, increasing frequency in falls, frequent urinary tract infections, HTN, hyperlipidemia, and macular degeneration.   I spoke today with Alyssa Jacobson's daughter who is Product managerHCPOA, AnimatorCorinda Jacobson. Alyssa Jacobson relates concerns about Alyssa Jacobson's general decline and increase in frequency of UTI's. She says that Alyssa Jacobson has been evaluated for in home care assistance [facility] (referred by her insurance agency) but is currently paying $1000/month out of pocket for 24 hour care.   I scheduled a home/face to face visit with Alyssa Jacobson and her daughter Alyssa Jacobson for next week on Wednesday at 10am so we can sit down to review her needs and address community resources.   Plan: I will collaborate with Alyssa FewScott Forrest LCSW and Dr. Margo Jacobson regarding Mrs. Alyssa Jacobson's care.    [name] MHA,BSN,RN,CCM [facility] Care Management  [phone]

## 2015-11-08 ENCOUNTER — Encounter: Payer: Self-pay | Admitting: *Deleted

## 2015-11-08 ENCOUNTER — Other Ambulatory Visit: Payer: Self-pay | Admitting: *Deleted

## 2015-11-08 NOTE — Patient Outreach (Signed)
[facility]) Care Management   11/08/2015  ADREONA BRAND [birth year] [id number]  LAMA NARAYANAN is an 82 y.o. female with past medical history of hypertension, hyperlipidemia, dementia, and macular degeneration. Mrs. Cartelli had 3 falls prior to a right hip fracture which required surgical repair in November 2016 and has had 2 falls since her surgery. Mrs. Huckeby spent a month at a skilled nursing facility after her hospitalization and now is home where she requires 24/7 care mostly because of her dementia. She has had several urinary tract infections over the last 6 months. Over the last month, Mrs. Holshouser has had notable difficulty straightening her left knee and her family and caregivers note that she "tends to let her knee fall in and her foot goes out".   Mrs. Schwier's daughter Salley Scarlet who shares joint HCPOA with her brother, states that Mrs. Smalls's health has generally declined over the last several months but she notices in particular that [name]'s confusion is worsening, particularly in the afternoon/evening and she is having more and more difficulty swallowing pills. Her appetite is declining.  Diet is supplemented with Boost twice daily. States current weight is 118 pounds, down from 125 approximately 6 weeks ago.   Subjective: "She's had an awful cough and has been sounding worse every morning."  Objective: BP 110/50 mmHg  Pulse 71  Temp(Src) 99.8 F (37.7 C)  Ht 1.575 m ( )  Wt 118 lb (53.524 kg)  BMI 21.58 kg/m2  SpO2 92%   Review of Systems  Constitutional: Positive for fever, weight loss and malaise/fatigue. Negative for chills.  HENT: Negative.   Eyes: Positive for blurred vision.  Respiratory: Positive for cough. Negative for hemoptysis, sputum production, shortness of breath and wheezing.   Cardiovascular: Negative for chest pain and leg swelling.  Gastrointestinal: Positive for diarrhea.  Genitourinary:        Incontinent of urine  Musculoskeletal: Positive for myalgias and joint pain. Negative for falls.       Left knee pain upon rising after lying or sitting for a period of time/period of immobility  Skin: Negative.   Neurological: Positive for weakness.  Psychiatric/Behavioral: Positive for memory loss.    Physical Exam  Constitutional: She appears well-developed and well-nourished. She appears lethargic. She has a sickly appearance. She appears ill. No distress.  Cardiovascular: Normal rate and regular rhythm.   Murmur heard. Respiratory: Effort normal. No respiratory distress. She has no wheezes. She has rales. She exhibits no tenderness.  GI: Soft. Bowel sounds are normal.  Neurological: She appears lethargic.  Skin: Skin is warm and dry.  Psychiatric: She is slowed. Cognition and memory are impaired. She expresses inappropriate judgment. She exhibits abnormal recent memory and abnormal remote memory.  Barely communicative    Encounter Medications:   Outpatient Encounter Prescriptions as of 11/08/2015  Medication Sig Note  . acetaminophen (TYLENOL) 325 MG tablet Take 2 tablets (650 mg total) by mouth every 6 (six) hours as needed for mild pain (or Fever >/= 101).   Marland Kitchen ALPRAZolam (XANAX) 0.25 MG tablet Take 1 tablet (0.25 mg total) by mouth 2 (two) times daily as needed for anxiety.   Marland Kitchen aspirin 325 MG tablet Take 1 tablet (325 mg total) by mouth daily.   . Calcium Carb-Cholecalciferol (CALCIUM 600 + D PO) Take 1 tablet by mouth 2 (two) times daily.   . folic acid (FOLVITE) 800 MCG tablet Take 800 mcg by mouth daily.   . multivitamin-lutein (OCUVITE-LUTEIN) CAPS capsule  Valsartan-hydrochlorothiazide (Diovan-HCTZ 320/12.5 Take 1 capsule by mouth daily.  Take 1 caplet by mouth daily.       Functional Status:   In your present state of health, do you have any difficulty performing the following activities: 10/30/2015 10/30/2015  Hearing? N N  Vision? Y Y  Difficulty concentrating or  making decisions? Malvin Johns  Walking or climbing stairs? Y Y  Dressing or bathing? Y Y  Doing errands, shopping? Malvin Johns  Preparing Food and eating ? Y Y  Using the Toilet? Y Y  In the past six months, have you accidently leaked urine? Y Y  Do you have problems with loss of bowel control? N N  Managing your Medications? Y Y  Managing your Finances? Malvin Johns  Housekeeping or managing your Housekeeping? Malvin Johns    Fall/Depression Screening:    PHQ 2/9 Scores 10/30/2015 10/26/2015  PHQ - 2 Score 2 -  PHQ- 9 Score 7 -  Exception Documentation - Medical reason    Assessment:   82 year old female patient living in [place] in her home with 24/7 private duty caregivers whose health is generally declining and family is inquiring about care options and treatment for acute health concerns.  Acute Health Condition (respiratory symptoms) - Mrs. Simons has coarse rales throughout inspiration and expiration today. She has a low grade fever @ 99.8. O2 sat is 92%. Patient is lethargic and per daughter and caregiver is "weaker than usual" and has been sleeping most of the morning. Family/Caregivers report cough since 11/29/15 but state it has worsened over last 24-48hours. Appetite is poor. Pt unable to expectorate mostly related to difficulty following instructions. Family states patient is having difficulty swallowing pills (new).    Level of Care concerns - Mrs. Kampf is cared for at home 24/7 by her daughter and private duty caregivers. Her daughter is strying to access benefits to assist with in home care. [facility] has been to the home to perform an evaluation and has requested completion of documentation sent to primary care provider.   ?Acute Health Condition (knee weakness and discomfort) - Mrs. Pidcock's daughter and caregivers have noticed over the last month that Mrs. Kopischke is not bearing as much weight on her left knee, that the  "knee goes in and the foot goes out" when she stands, and that she has  discomfort when trying to move when she has been either lying in bed or sitting for a period of time. Her daughter would like to have this evaluated by Dr. Renaye Rakers (performed hip surgery) when her respiratory condition improves.   Plan:  I notified Dr. Scharlene Gloss office in person of Mrs. Rottman's respiratory symptoms.   I will follow up with Mrs. Merilynn Finland tomorrow.     [facility] CM Care Plan Problem One        Most Recent Value   Care Plan Problem One  Acute Health condition (respiratory)   Role Documenting the Problem One  Care Management Coordinator   Care Plan for Problem One  Active   [facility] Long Term Goal (31-90 days)  Over the next 31 days, patient's daughter and caregivers will verbalize understanding of paln of care for respiratory condition    [facility] Long Term Goal Start Date  11/08/15   Interventions for Problem One Long Term Goal  Physical examination performed and symptoms reported to primary care provider,  advised daughter of symptoms and plan to inquire with provider about treatment   [facility] CM Short Term Goal #  1 (0-30 days)  Over the next 30 days, patient's family/cargivers will administer medications as prescribed for traetment of respiratory condition   [facility] CM Short Term Goal #1 Start Date  11/08/15   Interventions for Short Term Goal #1  Utilizing teachback method, advised patient family/caregiver on likelihood of treatment with oral medications and need to follow prescribing instrcuctions exactly   [facility] CM Short Term Goal #2 (0-30 days)  Over the next 30 days, patient's family/caregivers will report new or worsening symtpoms   [facility] CM Short Term Goal #2 Start Date  11/08/15   Interventions for Short Term Goal #2  Utilizing teachback method reviewed with daughter/caregiver importance of report changing symptoms and/or presenting patien to ED for acute/worsened shortness of breath, decreased level of consciousnes, or other new or acutely worsened symtpom    [facility] CM Care Plan Problem Two         Most Recent Value   Care Plan Problem Two  Level of care Concerns   Role Documenting the Problem Two  Care Management Coordinator   Care Plan for Problem Two  Active   Interventions for Problem Two Long Term Goal   Utilizing teachback method, reviewed with patient's daughter level of care available to patient and assisted with jpaperwork requested by insurance company from provider for possible in home care benefit   [facility] Long Term Goal (31-90) days  Over the next 31 days, patient's daughter will verbalize understanding of long term level of care options for patient   [facility] Long Term Goal Start Date  11/08/15   [facility] CM Short Term Goal #1 (0-30 days)  Over the next 30 days, patient's daughter will discsus with son level of care options available   [facility] CM Short Term Goal #1 Start Date  11/08/15   Interventions for Short Term Goal #2   Requested that patient's daughter discuss further,  requested paperwork completion from primary care provider      Marja Kayslisa Kianna Billet [facility]HA,BSN,RN,CCM [facility] Care Management  [phone]

## 2015-11-09 ENCOUNTER — Other Ambulatory Visit: Payer: Self-pay | Admitting: *Deleted

## 2015-11-09 NOTE — Patient Outreach (Signed)
[facility]) Care Management   11/09/2015  Aretha Parrotudrey R Asmar 07/17/1922 [id number]  Aretha Parrotudrey R Bekker is an 82 y.o. female whom I saw at home for our initial home visit yesterday in response to a request by her primary care provider for assistance with resources and management of her chronic health conditions with dementia and growing needs.   During our visit, I noted that [name] was febrile and quite congested. Her family reported that her symptoms began a week ago but worsened over the last 72 hours. We contacted Dr. Margo AyeHall who prescribed oral antibiotics which were started last evening.   I saw Mrs. Hartin at home today for follow up.   Subjective: "She feels a whole lot better already. She's able to cough up some of that congestion"  Objective:  Pulse 84  SpO2 96%  Review of Systems  Constitutional: Positive for malaise/fatigue. Negative for fever, chills and diaphoresis.  Respiratory: Positive for cough and sputum production. Negative for wheezing.   Cardiovascular: Negative for chest pain.    Physical Exam  Constitutional: She appears well-developed and well-nourished.  Cardiovascular: Normal rate.   Murmur heard. Respiratory: Effort normal. No respiratory distress. She has no wheezes. She has rales in the right upper field, the right middle field, the right lower field, the left upper field, the left middle field and the left lower field.  Neurological: She is alert.  Skin: Skin is warm and dry.    Assessment:  82 year old lady living at home in [place] living at home with superb in home/pvt duty care and a very attentive family. Improvement in symptoms noted already after 2 doses of oral antibiotics. She is afebrile today and O2 Sat is improved from 92%-96% yesterday. She remains congested but her breath sounds are slightly improved. She is more alert and has eaten breakfast anda Boost nutrition supplement today. Her caregiver reports that she  has been more alert and seemed to feel better and was able to expectorate quite a bit of clear to light yellow sputum this morning.   Plan: Mrs. Alf's family and caregivers will continue antibiotics as prescribed and will call for new or worsened symptoms. I will follow up with Mrs. Rohrbach by phone next week.    Marja Kayslisa Breyer Tejera MHA,BSN,RN,CCM [facility] Care Management  [phone]

## 2015-11-13 ENCOUNTER — Other Ambulatory Visit: Payer: Self-pay | Admitting: Licensed Clinical Social Worker

## 2015-11-13 NOTE — Patient Outreach (Signed)
Assessment:  CSW spoke via phone with Alyssa Jacobson, daughter of client on 11/13/15. CSW verified identity of Alyssa Jacobson. Alyssa and CSW spoke of client needs. Alyssa reported that client receives care in the home 24/7.  She said RN Alyssa Jacobson had visited recently with client at home of client. Alyssa reported that client needs daily care with activities of daily living, with standing, and with going to and from the bathroom.  Alyssa said that client uses walker to ambulate.  Alyssa said that client had begun to receive in home physical therapy support with [facility] [place]  Alyssa said that she thinks in home physical therapy sessions will be helpful to client.  Alyssa said client was eating small meals.  CSW and Alyssa spoke of client care plan. CSW encouraged that client attend all scheduled client medical appointments in next 30 days. CSW encouraged Alyssa Jacobson to call CSW at [phone] to discuss social work needs of client.      Plan:  Client to attend all scheduled client medical appointments in next 30 days. CSW to call client/Alyssa Jacobson in 4 weeks to assess client needs.  Alyssa Jacobson MSW, LCSW Licensed Clinical Social Worker [facility] Care Management [phone]

## 2015-11-15 ENCOUNTER — Other Ambulatory Visit: Payer: Self-pay | Admitting: *Deleted

## 2015-11-15 NOTE — Patient Outreach (Signed)
[facility]) Care Management   11/15/2015  Alyssa Jacobson 09/28/1922 [id number]  Alyssa Parrotudrey R Cawley is an 82 y.o. female whom I saw at home for our initial home visit last week in response to a request by her primary care provider for assistance with resources and management of her chronic health conditions with dementia and growing needs.   During our visit, I noted that Alyssa Jacobson was febrile and quite congested with symptoms consistent with respiratory infection. We contacted Dr. Margo AyeHall who prescribed oral antibiotics. I saw Alyssa Jacobson in follow up last week and she was showing improvement.   Today, Alyssa Jacobson's daughter called to say she was concerned that Alyssa Jacobson wasn't getting better. I visited for assessment. Findings as outlined below.    Subjective: "Hi. I remember you."  Objective:  BP 118/72 mmHg  Pulse 68  SpO2 96%  Review of Systems  Constitutional: Positive for malaise/fatigue. Negative for fever, chills, weight loss and diaphoresis.  HENT: Negative.   Eyes:       Legally blind  Respiratory: Positive for cough and sputum production. Negative for hemoptysis, shortness of breath and wheezing.        Intermittent cough productive of clear sputum, easily cleared by patient  Cardiovascular: Negative for chest pain, orthopnea and leg swelling.  Gastrointestinal: Negative.   Genitourinary: Negative.        Chronic urinary incontinence  Musculoskeletal: Positive for myalgias. Negative for falls.  Skin: Negative.   Neurological: Positive for weakness. Negative for dizziness and focal weakness.  Psychiatric/Behavioral: Positive for memory loss.    Physical Exam  Constitutional: Vital signs are normal. She appears well-developed and well-nourished. She has a sickly appearance.  Cardiovascular: Normal rate and regular rhythm.   Murmur heard. Respiratory: Effort normal. No respiratory distress. She has no wheezes. She has no rhonchi. She  has rales.  Neurological: She is alert.  Skin: Skin is warm and dry.  Psychiatric: She has a normal mood and affect. Her speech is normal and behavior is normal. Cognition and memory are impaired. She exhibits abnormal recent memory.    Encounter Medications:   Outpatient Encounter Prescriptions as of 11/15/2015  Medication Sig Note  . acetaminophen (TYLENOL) 325 MG tablet Take 2 tablets (650 mg total) by mouth every 6 (six) hours as needed for mild pain (or Fever >/= 101).   Marland Kitchen. ALPRAZolam (XANAX) 0.25 MG tablet Take 1 tablet (0.25 mg total) by mouth 2 (two) times daily as needed for anxiety.   Marland Kitchen. aspirin 325 MG tablet Take 1 tablet (325 mg total) by mouth daily. (Patient not taking: Reported on 11/08/2015)   . Calcium Carb-Cholecalciferol (CALCIUM 600 + D PO) Take 1 tablet by mouth 2 (two) times daily.   . cefpodoxime (VANTIN) 200 MG tablet Take 1 tablet (200 mg total) by mouth every 12 (twelve) hours. For 4 more days   . CRESTOR 20 MG tablet Take 1 tablet by mouth daily. Reported on 11/08/2015   . ferrous sulfate 325 (65 FE) MG tablet Take 1 tablet (325 mg total) by mouth 3 (three) times daily after meals. (Patient not taking: Reported on 11/08/2015)   . folic acid (FOLVITE) 800 MCG tablet Take 800 mcg by mouth daily.   Marland Kitchen. HYDROcodone-acetaminophen (NORCO) 5-325 MG tablet Take 1-2 tablets by mouth every 6 (six) hours as needed for moderate pain. (Patient not taking: Reported on 11/08/2015)   . multivitamin-lutein (OCUVITE-LUTEIN) CAPS capsule Take 1 capsule by mouth daily.   Marland Kitchen. NUEDEXTA  20-10 MG CAPS Take 1 capsule by mouth 2 (two) times daily. 05/02/2015: Received from: External Pharmacy Received Sig:   . PRESCRIPTION MEDICATION Place 1 drop into both eyes every 6 (six) weeks. Reported on 11/08/2015 05/02/2015: Received injection 2 weeks prior approx  . senna (SENOKOT) 8.6 MG TABS tablet Take 1 tablet (8.6 mg total) by mouth daily.   . valsartan-hydrochlorothiazide (DIOVAN-HCT) 320-12.5 MG tablet Take 1  tablet by mouth daily.    Assessment:  82 year old female with dementia living in [place] in her home. Daughter/HCPOA lives nearby and is very attentive. Patient has 24/7 pvt duty care. She is being followed at [facility] by [facility] and HHPT. Treatment started last week for respiratory infection.   Acute Health Condition (URI) - Alyssa Jacobson appears improved today. She is alert and said she remembered me. She said she feels better but still "isn't 100%". Her physical therapist is visiting today and she is to get up and ambulate with walker. Her O2 sat is 96% today, breath sounds are improved although she still has expiratory rales. She has an intermittent cough productive of clear sputum. Mrs. Branum has poor appetite but is taking fluids. I encouraged small portions throughout the day. I advised Mrs. Muto's family to call for fever, worsening cough, or other new or worsening symptom.   Plan: I will call Mrs. Merilynn Finland for follow up next week. Dr. Margo Aye was notified of today's assessment.    Marja Kays MHA,BSN,RN,CCM [facility] Care Management  [phone]

## 2015-11-22 ENCOUNTER — Ambulatory Visit: Payer: Self-pay | Admitting: *Deleted

## 2015-11-24 ENCOUNTER — Other Ambulatory Visit: Payer: Self-pay | Admitting: *Deleted

## 2015-11-24 NOTE — Patient Outreach (Signed)
[facility]) Care Management  11/24/2015  Alyssa Jacobson 05/05/1923 [id number]  Call received from [name]'s daughter and caregiver reporting foul smelling and cloudy urine. A urine sample was obtained and taken to Dr.Hall's office. I reached out to Dr. Margo AyeHall and reported symptoms. He is going to have prescription for Cipro faxed to pharmacy.   Family and caregiver notified.    [name] MHA,BSN,RN,CCM [facility] Care Management  [phone]

## 2015-11-27 ENCOUNTER — Inpatient Hospital Stay (HOSPITAL_COMMUNITY)
Admission: EM | Admit: 2015-11-27 | Discharge: 2015-11-29 | DRG: 871 | Disposition: A | Discharge status: Skilled Nursing Facility | Payer: PPO | Attending: Internal Medicine | Admitting: Internal Medicine

## 2015-11-27 ENCOUNTER — Encounter (HOSPITAL_COMMUNITY): Payer: Self-pay | Admitting: Emergency Medicine

## 2015-11-27 ENCOUNTER — Emergency Department (HOSPITAL_COMMUNITY): Payer: PPO

## 2015-11-27 DIAGNOSIS — J9601 Acute respiratory failure with hypoxia: Secondary | ICD-10-CM | POA: Diagnosis present

## 2015-11-27 DIAGNOSIS — H3530 Unspecified macular degeneration: Secondary | ICD-10-CM | POA: Diagnosis present

## 2015-11-27 DIAGNOSIS — J189 Pneumonia, unspecified organism: Secondary | ICD-10-CM | POA: Diagnosis present

## 2015-11-27 DIAGNOSIS — N390 Urinary tract infection, site not specified: Secondary | ICD-10-CM | POA: Diagnosis present

## 2015-11-27 DIAGNOSIS — F0390 Unspecified dementia without behavioral disturbance: Secondary | ICD-10-CM | POA: Diagnosis present

## 2015-11-27 DIAGNOSIS — E43 Unspecified severe protein-calorie malnutrition: Secondary | ICD-10-CM | POA: Diagnosis present

## 2015-11-27 DIAGNOSIS — Z6820 Body mass index (BMI) 20.0-20.9, adult: Secondary | ICD-10-CM

## 2015-11-27 DIAGNOSIS — A419 Sepsis, unspecified organism: Secondary | ICD-10-CM | POA: Diagnosis not present

## 2015-11-27 DIAGNOSIS — I10 Essential (primary) hypertension: Secondary | ICD-10-CM | POA: Diagnosis present

## 2015-11-27 DIAGNOSIS — R531 Weakness: Secondary | ICD-10-CM

## 2015-11-27 DIAGNOSIS — Z66 Do not resuscitate: Secondary | ICD-10-CM | POA: Diagnosis present

## 2015-11-27 DIAGNOSIS — E7800 Pure hypercholesterolemia, unspecified: Secondary | ICD-10-CM | POA: Diagnosis present

## 2015-11-27 DIAGNOSIS — E785 Hyperlipidemia, unspecified: Secondary | ICD-10-CM | POA: Diagnosis present

## 2015-11-27 HISTORY — DX: Unspecified dementia, unspecified severity, without behavioral disturbance, psychotic disturbance, mood disturbance, and anxiety: F03.90

## 2015-11-27 LAB — URINALYSIS, ROUTINE W REFLEX MICROSCOPIC
BILIRUBIN URINE: NEGATIVE
GLUCOSE, UA: NEGATIVE mg/dL
KETONES UR: NEGATIVE mg/dL
Nitrite: NEGATIVE
PH: 5.5 (ref 5.0–8.0)
Protein, ur: 30 mg/dL — AB
SPECIFIC GRAVITY, URINE: 1.03 (ref 1.005–1.030)

## 2015-11-27 LAB — HEPATIC FUNCTION PANEL
ALK PHOS: 47 U/L (ref 38–126)
ALT: 10 U/L — AB (ref 14–54)
AST: 18 U/L (ref 15–41)
Albumin: 3 g/dL — ABNORMAL LOW (ref 3.5–5.0)
BILIRUBIN DIRECT: 0.2 mg/dL (ref 0.1–0.5)
BILIRUBIN INDIRECT: 0.5 mg/dL (ref 0.3–0.9)
BILIRUBIN TOTAL: 0.7 mg/dL (ref 0.3–1.2)
Total Protein: 6.7 g/dL (ref 6.5–8.1)

## 2015-11-27 LAB — URINE MICROSCOPIC-ADD ON

## 2015-11-27 LAB — BASIC METABOLIC PANEL
ANION GAP: 7 (ref 5–15)
BUN: 19 mg/dL (ref 6–20)
CALCIUM: 8.9 mg/dL (ref 8.9–10.3)
CO2: 29 mmol/L (ref 22–32)
CREATININE: 0.87 mg/dL (ref 0.44–1.00)
Chloride: 99 mmol/L — ABNORMAL LOW (ref 101–111)
GFR calc Af Amer: >60 mL/min (ref >60)
GFR, EST NON AFRICAN AMERICAN: 57 mL/min — AB (ref >60)
GLUCOSE: 116 mg/dL — AB (ref 65–99)
Potassium: 4.2 mmol/L (ref 3.5–5.1)
Sodium: 135 mmol/L (ref 135–145)

## 2015-11-27 LAB — CBC WITH DIFFERENTIAL/PLATELET
BASOS ABS: 0 10*3/uL (ref 0.0–0.1)
BASOS PCT: 0 %
EOS PCT: 0 %
Eosinophils Absolute: 0 10*3/uL (ref 0.0–0.7)
HCT: 38.3 % (ref 36.0–46.0)
Hemoglobin: 12 g/dL (ref 12.0–15.0)
LYMPHS PCT: 10 %
Lymphs Abs: 1.1 10*3/uL (ref 0.7–4.0)
MCH: 30.9 pg (ref 26.0–34.0)
MCHC: 31.3 g/dL (ref 30.0–36.0)
MCV: 98.7 fL (ref 78.0–100.0)
Monocytes Absolute: 1.7 10*3/uL — ABNORMAL HIGH (ref 0.1–1.0)
Monocytes Relative: 16 %
Neutro Abs: 8.1 10*3/uL — ABNORMAL HIGH (ref 1.7–7.7)
Neutrophils Relative %: 74 %
PLATELETS: 245 10*3/uL (ref 150–400)
RBC: 3.88 MIL/uL (ref 3.87–5.11)
RDW: 13 % (ref 11.5–15.5)
WBC: 10.9 10*3/uL — ABNORMAL HIGH (ref 4.0–10.5)

## 2015-11-27 LAB — TROPONIN I: TROPONIN I: 0.03 ng/mL (ref <0.031)

## 2015-11-27 LAB — LACTIC ACID, PLASMA
Lactic Acid, Venous: 1.5 mmol/L (ref 0.5–2.0)
Lactic Acid, Venous: 1.7 mmol/L (ref 0.5–2.0)

## 2015-11-27 LAB — LIPASE, BLOOD: Lipase: 17 U/L (ref 11–51)

## 2015-11-27 MED ORDER — GUAIFENESIN ER 600 MG PO TB12
600.0000 mg | ORAL_TABLET | Freq: Two times a day (BID) | ORAL | Status: DC
  Administered 2015-11-27 – 2015-11-29 (×3): 600 mg via ORAL
  Filled 2015-11-27 (×3): qty 1

## 2015-11-27 MED ORDER — DEXTROSE 5 % IV SOLN
1.0000 g | Freq: Once | INTRAVENOUS | Status: AC
  Administered 2015-11-27: 1 g via INTRAVENOUS
  Filled 2015-11-27: qty 10

## 2015-11-27 MED ORDER — VITAMIN C 500 MG PO TABS
250.0000 mg | ORAL_TABLET | Freq: Every day | ORAL | Status: DC
  Administered 2015-11-29: 250 mg via ORAL
  Filled 2015-11-27: qty 0.5
  Filled 2015-11-27 (×2): qty 1
  Filled 2015-11-27: qty 0.5
  Filled 2015-11-27 (×2): qty 1

## 2015-11-27 MED ORDER — DEXTROSE 5 % IV SOLN
500.0000 mg | INTRAVENOUS | Status: DC
  Administered 2015-11-28 – 2015-11-29 (×2): 500 mg via INTRAVENOUS
  Filled 2015-11-27 (×3): qty 500

## 2015-11-27 MED ORDER — CALCIUM CARBONATE-VITAMIN D 500-200 MG-UNIT PO TABS
1.0000 | ORAL_TABLET | Freq: Every day | ORAL | Status: DC
  Administered 2015-11-29: 1 via ORAL
  Filled 2015-11-27 (×3): qty 1

## 2015-11-27 MED ORDER — ALPRAZOLAM 0.5 MG PO TABS
0.5000 mg | ORAL_TABLET | Freq: Two times a day (BID) | ORAL | Status: DC | PRN
  Administered 2015-11-27 – 2015-11-28 (×2): 0.5 mg via ORAL
  Filled 2015-11-27 (×2): qty 1

## 2015-11-27 MED ORDER — IRBESARTAN 300 MG PO TABS: 300.0000 mg | ORAL_TABLET | Freq: Every day | ORAL | Status: DC

## 2015-11-27 MED ORDER — ACETAMINOPHEN 325 MG PO TABS
650.0000 mg | ORAL_TABLET | Freq: Four times a day (QID) | ORAL | Status: DC | PRN
  Administered 2015-11-28 (×2): 650 mg via ORAL
  Filled 2015-11-27 (×2): qty 2

## 2015-11-27 MED ORDER — RISPERIDONE 1 MG PO TABS
1.0000 mg | ORAL_TABLET | Freq: Every day | ORAL | Status: DC
  Administered 2015-11-27 – 2015-11-28 (×2): 1 mg via ORAL
  Filled 2015-11-27 (×2): qty 1

## 2015-11-27 MED ORDER — PANTOPRAZOLE SODIUM 40 MG PO TBEC: 40.0000 mg | DELAYED_RELEASE_TABLET | Freq: Every day | ORAL | Status: DC

## 2015-11-27 MED ORDER — DEXTROSE 5 % IV SOLN
500.0000 mg | Freq: Once | INTRAVENOUS | Status: AC
  Administered 2015-11-27: 500 mg via INTRAVENOUS
  Filled 2015-11-27: qty 500

## 2015-11-27 MED ORDER — FOLIC ACID 1 MG PO TABS
1.0000 mg | ORAL_TABLET | Freq: Every day | ORAL | Status: DC
  Administered 2015-11-29: 1 mg via ORAL
  Filled 2015-11-27: qty 1

## 2015-11-27 MED ORDER — ALBUTEROL SULFATE (2.5 MG/3ML) 0.083% IN NEBU: 2.5000 mg | INHALATION_SOLUTION | RESPIRATORY_TRACT | Status: DC | PRN

## 2015-11-27 MED ORDER — DONEPEZIL HCL 5 MG PO TABS
5.0000 mg | ORAL_TABLET | Freq: Every day | ORAL | Status: DC
  Administered 2015-11-29: 5 mg via ORAL
  Filled 2015-11-27: qty 1

## 2015-11-27 MED ORDER — IPRATROPIUM-ALBUTEROL 0.5-2.5 (3) MG/3ML IN SOLN
3.0000 mL | Freq: Once | RESPIRATORY_TRACT | Status: AC
  Administered 2015-11-27: 3 mL via RESPIRATORY_TRACT
  Filled 2015-11-27: qty 3

## 2015-11-27 MED ORDER — IPRATROPIUM-ALBUTEROL 0.5-2.5 (3) MG/3ML IN SOLN
3.0000 mL | Freq: Four times a day (QID) | RESPIRATORY_TRACT | Status: DC
  Administered 2015-11-27 – 2015-11-29 (×8): 3 mL via RESPIRATORY_TRACT
  Filled 2015-11-27 (×8): qty 3

## 2015-11-27 MED ORDER — CETYLPYRIDINIUM CHLORIDE 0.05 % MT LIQD
7.0000 mL | Freq: Two times a day (BID) | OROMUCOSAL | Status: DC
  Administered 2015-11-27 – 2015-11-29 (×4): 7 mL via OROMUCOSAL

## 2015-11-27 MED ORDER — SODIUM CHLORIDE 0.9 % IV SOLN
INTRAVENOUS | Status: AC
  Administered 2015-11-27: 18:00:00 via INTRAVENOUS

## 2015-11-27 MED ORDER — ALBUTEROL SULFATE (2.5 MG/3ML) 0.083% IN NEBU: 2.5000 mg | INHALATION_SOLUTION | RESPIRATORY_TRACT | Status: DC

## 2015-11-27 MED ORDER — AZITHROMYCIN 250 MG PO TABS
500.0000 mg | ORAL_TABLET | Freq: Once | ORAL | Status: DC
  Filled 2015-11-27: qty 2

## 2015-11-27 MED ORDER — DEXTROSE 5 % IV SOLN
1.0000 g | INTRAVENOUS | Status: DC
  Administered 2015-11-28 – 2015-11-29 (×2): 1 g via INTRAVENOUS
  Filled 2015-11-27 (×3): qty 10

## 2015-11-27 MED ORDER — ENOXAPARIN SODIUM 30 MG/0.3ML ~~LOC~~ SOLN: 30.0000 mg | SUBCUTANEOUS | Status: DC

## 2015-11-27 MED ORDER — SODIUM CHLORIDE 0.9 % IV SOLN
INTRAVENOUS | Status: DC
  Administered 2015-11-27: 13:00:00 via INTRAVENOUS

## 2015-11-27 MED ORDER — OCUVITE-LUTEIN PO CAPS
1.0000 | ORAL_CAPSULE | Freq: Every day | ORAL | Status: DC
  Administered 2015-11-29: 1 via ORAL
  Filled 2015-11-27: qty 1

## 2015-11-27 MED ORDER — BENZONATATE 100 MG PO CAPS: 200.0000 mg | ORAL_CAPSULE | Freq: Two times a day (BID) | ORAL | Status: DC | PRN

## 2015-11-27 NOTE — ED Notes (Signed)
Pt family at bedside. Also reports pt has been complaining of right foot pain.

## 2015-11-27 NOTE — Progress Notes (Signed)
Pharmacy Antibiotic Note  Alyssa Jacobson is a 82 y.o. female admitted on 11/27/2015 with pneumonia.  Pharmacy has been consulted for renal dose antibiotics.  She is currently on azithromycin and rocephin.  Neither require adjustment for renal function.  Plan: Cont antibiotics as ordered Pharmacy will sign off.  Please advise if we can be of further assistance  Height: 5\' 6"  (167.6 cm) Weight: 120 lb (54.432 kg) IBW/kg (Calculated) : 59.3  Temp (24hrs), Avg:99 F (37.2 C), Min:97.9 F (36.6 C), Max:100 F (37.8 C)   Recent Labs Lab 11/27/15 1200 11/27/15 1259  WBC 10.9*  --   CREATININE 0.87  --   LATICACIDVEN  --  1.5    Estimated Creatinine Clearance: 36.9 mL/min (by C-G formula based on Cr of 0.87).    Allergies  Allergen Reactions  . Sulfa Antibiotics Other (See Comments)    unknown    Thank you for allowing pharmacy to be a part of this patient's care.  Talbert CageSeay, Avigdor Dollar Poteet 11/27/2015 4:25 PM

## 2015-11-27 NOTE — ED Provider Notes (Signed)
CSN: [id number]     Arrival date & time 11/27/15  1135 History   First MD Initiated Contact with Patient 11/27/15 1210     Chief Complaint  Patient presents with  . Weakness      Patient is a 82 y.o. female presenting with weakness. The history is provided by the EMS personnel, a relative and a caregiver. The history is limited by the condition of the patient (Hx dementia).  Weakness  Pt was seen at 1215. Per EMS and pt's family: Pt with increasing generalized weakness for the past 1 week, worse over the past 2 days. Pt currently unable to stand or walk today due to her weakness. Has been associated with SOB, cough, and poor PO intake. EMS states pt's O2 Sats were "83% R/A" on their arrival to scene, and increased to 97% on O2 2L N/C. Family states pt was taking abx 2 weeks ago for presumed pneumonia, and currently taking abx (started this past week) for UTI. Pt herself has significant hx of dementia.    Past Medical History  Diagnosis Date  . Macular degeneration of both eyes   . Hypertension   . High cholesterol   . Hip fracture (HCC)   . Ankle fracture   . Dementia    Past Surgical History  Procedure Laterality Date  . Femur im nail Right 05/03/2015    Procedure: INTRAMEDULLARY (IM) NAIL FEMORAL;  Surgeon: Sheral Apley, MD;  Location: [facility] OR;  Service: Orthopedics;  Laterality: Right;    Social History  Substance Use Topics  . Smoking status: Never Smoker   . Smokeless tobacco: None  . Alcohol Use: No    Review of Systems  Unable to perform ROS: Dementia  Neurological: Positive for weakness.      Allergies  Sulfa antibiotics  Home Medications   Prior to Admission medications   Medication Sig Start Date End Date Taking? Authorizing Provider  acetaminophen (TYLENOL) 325 MG tablet Take 2 tablets (650 mg total) by mouth every 6 (six) hours as needed for mild pain (or Fever >/= 101). 05/05/15   Osvaldo Shipper, MD  ALPRAZolam Prudy Feeler) 0.25 MG tablet Take 1 tablet  (0.25 mg total) by mouth 2 (two) times daily as needed for anxiety. 07/14/15   Gerhard Munch, MD  aspirin 325 MG tablet Take 1 tablet (325 mg total) by mouth daily. Patient not taking: Reported on 11/08/2015 05/03/15   Janalee Dane, PA-C  Calcium Carb-Cholecalciferol (CALCIUM 600 + D PO) Take 1 tablet by mouth 2 (two) times daily.    Historical Provider, MD  cefpodoxime (VANTIN) 200 MG tablet Take 1 tablet (200 mg total) by mouth every 12 (twelve) hours. For 4 more days 05/05/15   Osvaldo Shipper, MD  CRESTOR 20 MG tablet Take 1 tablet by mouth daily. Reported on 11/08/2015 07/23/13   Historical Provider, MD  ferrous sulfate 325 (65 FE) MG tablet Take 1 tablet (325 mg total) by mouth 3 (three) times daily after meals. Patient not taking: Reported on 11/08/2015 05/05/15   Osvaldo Shipper, MD  folic acid (FOLVITE) 800 MCG tablet Take 800 mcg by mouth daily.    Historical Provider, MD  HYDROcodone-acetaminophen (NORCO) 5-325 MG tablet Take 1-2 tablets by mouth every 6 (six) hours as needed for moderate pain. Patient not taking: Reported on 11/08/2015 05/03/15   Janalee Dane, PA-C  multivitamin-lutein [facility]) CAPS capsule Take 1 capsule by mouth daily.    Historical Provider, MD  NUEDEXTA 20-10 MG CAPS Take 1 capsule by  mouth 2 (two) times daily. 03/29/15   Historical Provider, MD  PRESCRIPTION MEDICATION Place 1 drop into both eyes every 6 (six) weeks. Reported on 11/08/2015    Historical Provider, MD  senna (SENOKOT) 8.6 MG TABS tablet Take 1 tablet (8.6 mg total) by mouth daily. 05/05/15   Osvaldo Shipper, MD  valsartan-hydrochlorothiazide (DIOVAN-HCT) 320-12.5 MG tablet Take 1 tablet by mouth daily.    Benita Stabile, MD   BP 126/67 mmHg  Pulse 95  Temp(Src) 100 F (37.8 C) (Rectal)  Resp 20  Ht  (1.676 m)  Wt 120 lb (54.432 kg)  BMI 19.38 kg/m2  SpO2 91% Physical Exam  1220: Physical examination:  Nursing notes reviewed; Vital signs and O2 SAT reviewed;  Constitutional: Well developed,  Well nourished, In no acute distress; Head:  Normocephalic, atraumatic; Eyes: EOMI, PERRL, No scleral icterus; ENMT: Mouth and pharynx normal, Mucous membranes dry; Neck: Supple, Full range of motion, No lymphadenopathy; Cardiovascular: Regular rate and rhythm, No gallop; Respiratory: Breath sounds coarse & equal bilaterally, No wheezes.  +moist cough during exam. Normal respiratory effort/excursion; Chest: Nontender, Movement normal; Abdomen: Soft, Nontender, Nondistended, Normal bowel sounds; Genitourinary: No CVA tenderness; Extremities: Pulses normal, No tenderness, No deformity. No edema, No calf edema or asymmetry.; Neuro: Awake, alert, confused per hx dementia. No facial droop. Speech clear. Grips equal. Strength 4/5 equal bilat UE's and LE's. Moves all extremities spontaneously without apparent gross focal motor deficits.; Skin: Color normal, Warm, Dry.   ED Course  Procedures (including critical care time) Labs Review  Imaging Review  I have personally reviewed and evaluated these images and lab results as part of my medical decision-making.   EKG Interpretation None      MDM  MDM Reviewed: previous chart, nursing note and vitals Reviewed previous: labs and ECG Interpretation: labs, ECG and x-ray     Results for orders placed or performed during the hospital encounter of 11/27/15  CBC with Differential  Result Value Ref Range   WBC 10.9 (H) 4.0 - 10.5 K/uL   RBC 3.88 3.87 - 5.11 MIL/uL   Hemoglobin 12.0 12.0 - 15.0 g/dL   HCT 40.9 81.1 - 91.4 %   MCV 98.7 78.0 - 100.0 fL   MCH 30.9 26.0 - 34.0 pg   MCHC 31.3 30.0 - 36.0 g/dL   RDW 78.2 95.6 - 21.3 %   Platelets 245 150 - 400 K/uL   Neutrophils Relative % 74 %   Neutro Abs 8.1 (H) 1.7 - 7.7 K/uL   Lymphocytes Relative 10 %   Lymphs Abs 1.1 0.7 - 4.0 K/uL   Monocytes Relative 16 %   Monocytes Absolute 1.7 (H) 0.1 - 1.0 K/uL   Eosinophils Relative 0 %   Eosinophils Absolute 0.0 0.0 - 0.7 K/uL   Basophils Relative 0 %    Basophils Absolute 0.0 0.0 - 0.1 K/uL  Basic metabolic panel  Result Value Ref Range   Sodium 135 135 - 145 mmol/L   Potassium 4.2 3.5 - 5.1 mmol/L   Chloride 99 (L) 101 - 111 mmol/L   CO2 29 22 - 32 mmol/L   Glucose, Bld 116 (H) 65 - 99 mg/dL   BUN 19 6 - 20 mg/dL   Creatinine, Ser 0.86 0.44 - 1.00 mg/dL   Calcium 8.9 8.9 - 57.8 mg/dL   GFR calc non Af Amer 57 (L) >60 mL/min   GFR calc Af Amer >60 >60 mL/min   Anion gap 7 5 - 15  Lactic  acid, plasma  Result Value Ref Range   Lactic Acid, Venous 1.5 0.5 - 2.0 mmol/L  Troponin I  Result Value Ref Range   Troponin I 0.03 <0.031 ng/mL  Hepatic function panel  Result Value Ref Range   Total Protein 6.7 6.5 - 8.1 g/dL   Albumin 3.0 (L) 3.5 - 5.0 g/dL   AST 18 15 - 41 U/L   ALT 10 (L) 14 - 54 U/L   Alkaline Phosphatase 47 38 - 126 U/L   Total Bilirubin 0.7 0.3 - 1.2 mg/dL   Bilirubin, Direct 0.2 0.1 - 0.5 mg/dL   Indirect Bilirubin 0.5 0.3 - 0.9 mg/dL  Lipase, blood  Result Value Ref Range   Lipase 17 11 - 51 U/L  Urinalysis, Routine w reflex microscopic  Result Value Ref Range   Color, Urine YELLOW YELLOW   APPearance CLEAR CLEAR   Specific Gravity, Urine 1.030 1.005 - 1.030   pH 5.5 5.0 - 8.0   Glucose, UA NEGATIVE NEGATIVE mg/dL   Hgb urine dipstick SMALL (A) NEGATIVE   Bilirubin Urine NEGATIVE NEGATIVE   Ketones, ur NEGATIVE NEGATIVE mg/dL   Protein, ur 30 (A) NEGATIVE mg/dL   Nitrite NEGATIVE NEGATIVE   Leukocytes, UA SMALL (A) NEGATIVE  Urine microscopic-add on  Result Value Ref Range   Squamous Epithelial / LPF 0-5 (A) NONE SEEN   WBC, UA 6-30 0 - 5 WBC/hpf   RBC / HPF 6-30 0 - 5 RBC/hpf   Bacteria, UA RARE (A) NONE SEEN   Urine-Other MUCOUS PRESENT    Dg Chest Portable 1 View 11/27/2015  CLINICAL DATA:  Shortness of breath and cough EXAM: PORTABLE CHEST 1 VIEW COMPARISON:  05/02/2015 FINDINGS: Cardiac shadow is stable. Elevation of left hemidiaphragm is again seen. Left basilar infiltrate is noted. The right  lung is clear. No bony abnormality is seen. IMPRESSION: Left basilar infiltrate. Electronically Signed   By: Alcide CleverMark  Lukens M.D.   On: 11/27/2015 12:26    Dg Foot Complete Right 11/27/2015  CLINICAL DATA:  Right foot pain, no known injury, initial encounter EXAM: RIGHT FOOT COMPLETE - 3+ VIEW COMPARISON:  None. FINDINGS: Generalized osteopenia is noted. No bony erosion is identified. No acute fracture dislocation is seen. IMPRESSION: No acute abnormality noted. Electronically Signed   By: Alcide CleverMark  Lukens M.D.   On: 11/27/2015 12:27    1525:  Short neb given. O2 Sats remain 97% on O2 2L N/C. Pt told ED RN her right foot hurt; XR reassuring. +UTI, UC pending. +CAP on CXR. IV rocephin and zithromax given.  Dx and testing d/w pt's family.  Questions answered.  Verb understanding, agreeable to admit. T/C to [facility] Dr. Katrinka BlazingSmith, case discussed, including:  HPI, pertinent PM/SHx, VS/PE, dx testing, ED course and treatment:  Agreeable to admit, requests to write temporary orders, obtain medical bed to team APAdmits.   Samuel JesterKathleen Jeane Cashatt, DO 11/29/15 2111

## 2015-11-27 NOTE — ED Notes (Signed)
O2 saturation decreased to 89% on RA, pt placed on O2 at 2L .

## 2015-11-27 NOTE — ED Notes (Signed)
Per EMS- family called out for generalized weakness and sob. EMS reports family states pt has not been taking po well and is currently on po cipro for UTI. EMS reports O2 saturation of 83% upon FD arrival to home.

## 2015-11-27 NOTE — H&P (Signed)
History and Physical    Alyssa Jacobson JXB:[id number] DOB: [birth year] DOA: 11/27/2015  Referring MD/NP/PA: Dr. Clarene Duke at [facility] PCP: Dwana Melena, MD  Patient coming from: home  Chief Complaint: Weakness  HPI: Alyssa Jacobson is a 82 y.o. female with medical history significant of HTN, HLD, dementia; who presents with complaints of weakness. History is obtained from the patient's daughter as the patient has dementia and is unable to provide her own at this time. At baseline the patient has dementia, but was able to ambulate with assistance of a walker. She lives in her own home and has a 24/7 caregiver. Approximately 2 weeks or more ago the patient developed a cough with clear stringy sputum. She had been evaluated by one of the [facility] nurses that comes out once or twice a week. At that time it was thought that the patient may have a 50-50 chance of pneumonia for which she was treated with what was thought to be amoxicillin. After the course of amoxicillin patient still was noted to have cough and family felt that they could hear her intermittently wheeze. Family called for the nurse to calm back out and check on her. At that time was thought that she may have a low-grade fever, but was not given any additional antibiotics at that time. Sputum production of cough changed to color during this interval. Physical therapy came out 4 days ago and noticed that the patient was weaker than normal. The nurse came back out to check on her approximately 3 days ago and they checked a urine sample due to her caregiver noticing a puslike discharge while cleaning her.The family took the sample to the doctor's office. Dr. Margo Aye had written a prescription for ciprofloxacin which the patient started that same day. 2 days ago family notes that they were unable to get her to stand to clean and change her. Daughter states over this last week that there is been a progressive decline in her cognitive status  and she has been less communicative. Associated symptoms include complaints of generalized abdominal pain, weight loss of approximately 5 pounds, and decreased appetite. Patient has not been eating and drinking. Family also notes that she had just set down in the middle for at least twice in the last week. They deny any falls, nausea vomiting  ED  Course: admission to admission department patient was evaluated and seen to be febrile with a temperature up to 100F, respirations up to 27, O2 saturations as low as 83% on room air improved to 97% on 2 L nasal cannula oxygen. Initial lab work revealed a WBC of 10.9, troponin I 0.03, lactic acid of 1.5, and BMP is otherwise unremarkable. UA was positive for rare bacteria, small leukocytes, and 6-30 WBCs. Chest x-ray showed a left basilar infiltrate.  Review of Systems: unable to obtain at this time secondary to dementia Past Medical History  Diagnosis Date  . Macular degeneration of both eyes   . Hypertension   . High cholesterol   . Hip fracture (HCC)   . Ankle fracture   . Dementia     Past Surgical History  Procedure Laterality Date  . Femur im nail Right 05/03/2015    Procedure: INTRAMEDULLARY (IM) NAIL FEMORAL;  Surgeon: Sheral Apley, MD;  Location: [facility] OR;  Service: Orthopedics;  Laterality: Right;     reports that she has never smoked. She does not have any smokeless tobacco history on file. She reports that she does  not drink alcohol or use illicit drugs.  Allergies  Allergen Reactions  . Sulfa Antibiotics Other (See Comments)    unknown    No family history on file.  Prior to Admission medications   Medication Sig Start Date End Date Taking? Authorizing Provider  acetaminophen (TYLENOL) 325 MG tablet Take 2 tablets (650 mg total) by mouth every 6 (six) hours as needed for mild pain (or Fever >/= 101). 05/05/15  Yes Osvaldo Shipper, MD  ALPRAZolam Prudy Feeler) 0.5 MG tablet Take 0.5 mg by mouth 2 (two) times daily.   Yes Historical  Provider, MD  Ascorbic Acid (VITAMIN C PO) Take by mouth.   Yes Historical Provider, MD  benzonatate (TESSALON) 100 MG capsule Take 200 mg by mouth 2 (two) times daily as needed for cough.   Yes Historical Provider, MD  Calcium Carb-Cholecalciferol (CALCIUM 600 + D PO) Take 1 tablet by mouth 2 (two) times daily.   Yes Historical Provider, MD  Cholecalciferol (VITAMIN D-3 PO) Take by mouth.   Yes Historical Provider, MD  ciprofloxacin (CIPRO) 250 MG tablet Take 250 mg by mouth 2 (two) times daily.   Yes Historical Provider, MD  folic acid (FOLVITE) 800 MCG tablet Take 800 mcg by mouth daily.   Yes Historical Provider, MD  Multiple Vitamins-Minerals (PRESERVISION AREDS PO) Take 1 tablet by mouth daily.   Yes Historical Provider, MD  omeprazole (PRILOSEC) 20 MG capsule Take 20 mg by mouth daily.   Yes Historical Provider, MD  risperiDONE (RISPERDAL) 1 MG tablet Take 1 mg by mouth at bedtime.   Yes Historical Provider, MD  valsartan (DIOVAN) 320 MG tablet Take 320 mg by mouth daily.   Yes Historical Provider, MD  donepezil (ARICEPT) 5 MG tablet Take 1 tablet by mouth daily. 11/11/15   Historical Provider, MD    Physical Exam: Filed Vitals:   11/27/15 1312 11/27/15 1400 11/27/15 1430 11/27/15 1500  BP:  110/54 107/59 119/58  Pulse:  82 83 85  Temp:      TempSrc:      Resp:  25 24 27   Height:      Weight:      SpO2: 97% 95% 97% 97%      Constitutional: acutely ill-appearing elderly female who is lethargic, but arousable to verbal command  Filed Vitals:   11/27/15 1312 11/27/15 1400 11/27/15 1430 11/27/15 1500  BP:  110/54 107/59 119/58  Pulse:  82 83 85  Temp:      TempSrc:      Resp:  25 24 27   Height:      Weight:      SpO2: 97% 95% 97% 97%   Eyes: PERRL, lids and conjunctivae normal ENMT: Mucous membranes are Dry. Posterior pharynx clear of any exudate or lesions.Normal dentition.  Neck: normal, supple, no masses, no thyromegaly Respiratory: Decreased aeration, tachypneic, no  wheezing, no crackles. Normal respiratory effort.  Cardiovascular: Regular rate and rhythm, no murmurs / rubs / gallops. No extremity edema. 2+ pedal pulses. No carotid bruits.  Abdomen: no tenderness, no masses palpated. No hepatosplenomegaly. Bowel sounds positive.  Musculoskeletal: no clubbing / cyanosis. No joint deformity upper and lower extremities. Good ROM, no contractures. Normal muscle tone.  Skin: no rashes, lesions, ulcers. No induration Neurologic: CN 2-12 grossly intact. Sensation intact, DTR normal. Strength 5/5 in all 4.  Psychiatric: Normal judgment and insight. Alert and oriented x 3. Normal mood.     Labs on Admission: I have personally reviewed following labs and imaging studies  CBC:  Recent Labs Lab 11/27/15 1200  WBC 10.9*  NEUTROABS 8.1*  HGB 12.0  HCT 38.3  MCV 98.7  PLT 245   Basic Metabolic Panel:  Recent Labs Lab 11/27/15 1200  NA 135  K 4.2  CL 99*  CO2 29  GLUCOSE 116*  BUN 19  CREATININE 0.87  CALCIUM 8.9   GFR: Estimated Creatinine Clearance: 36.9 mL/min (by C-G formula based on Cr of 0.87). Liver Function Tests:  Recent Labs Lab 11/27/15 1259  AST 18  ALT 10*  ALKPHOS 47  BILITOT 0.7  PROT 6.7  ALBUMIN 3.0*    Recent Labs Lab 11/27/15 1259  LIPASE 17   No results for input(s): AMMONIA in the last 168 hours. Coagulation Profile: No results for input(s): INR, PROTIME in the last 168 hours. Cardiac Enzymes:  Recent Labs Lab 11/27/15 1259  TROPONINI 0.03   BNP (last 3 results) No results for input(s): PROBNP in the last 8760 hours. HbA1C: No results for input(s): HGBA1C in the last 72 hours. CBG: No results for input(s): GLUCAP in the last 168 hours. Lipid Profile: No results for input(s): CHOL, HDL, LDLCALC, TRIG, CHOLHDL, LDLDIRECT in the last 72 hours. Thyroid Function Tests: No results for input(s): TSH, T4TOTAL, FREET4, T3FREE, THYROIDAB in the last 72 hours. Anemia Panel: No results for input(s):  VITAMINB12, FOLATE, FERRITIN, TIBC, IRON, RETICCTPCT in the last 72 hours. Urine analysis:    Component Value Date/Time   COLORURINE YELLOW 11/27/2015 1330   APPEARANCEUR CLEAR 11/27/2015 1330   LABSPEC 1.030 11/27/2015 1330   PHURINE 5.5 11/27/2015 1330   GLUCOSEU NEGATIVE 11/27/2015 1330   HGBUR SMALL* 11/27/2015 1330   BILIRUBINUR NEGATIVE 11/27/2015 1330   KETONESUR NEGATIVE 11/27/2015 1330   PROTEINUR 30* 11/27/2015 1330   UROBILINOGEN 1.0 05/02/2015 2110   NITRITE NEGATIVE 11/27/2015 1330   LEUKOCYTESUR SMALL* 11/27/2015 1330   Sepsis Labs: No results found for this or any previous visit (from the past 240 hour(s)).   Radiological Exams on Admission: Dg Chest Portable 1 View  11/27/2015  CLINICAL DATA:  Shortness of breath and cough EXAM: PORTABLE CHEST 1 VIEW COMPARISON:  05/02/2015 FINDINGS: Cardiac shadow is stable. Elevation of left hemidiaphragm is again seen. Left basilar infiltrate is noted. The right lung is clear. No bony abnormality is seen. IMPRESSION: Left basilar infiltrate. Electronically Signed   By: Alcide Clever M.D.   On: 11/27/2015 12:26   Dg Foot Complete Right  11/27/2015  CLINICAL DATA:  Right foot pain, no known injury, initial encounter EXAM: RIGHT FOOT COMPLETE - 3+ VIEW COMPARISON:  None. FINDINGS: Generalized osteopenia is noted. No bony erosion is identified. No acute fracture dislocation is seen. IMPRESSION: No acute abnormality noted. Electronically Signed   By: Alcide Clever M.D.   On: 11/27/2015 12:27    EKG: Independently reviewed. Sinus rhythm with old anteroseptal infarct  Assessment/Plan Suspected sepsis secondary to a CAP(community acquired pneumonia): Acute. Patient Had complaints of a cough for at least 2 weeks now. Patient with temperature up to 100F, pulse up to 105, and respiratory rate up to 27. Previously treated with Augmentin without relief of symptoms. Chest x-ray showed a left basilar infiltrate. Patient was placed on Rocephin and  ceftriaxone in the ED. - Admit to MedSurg bed - Empiric antibiotics of Rocephin and azithromycin - NS @ 75 cc/hr - Fever control prn tylenol - Repeat CBC in am - Sputum cultures - Blood cultures - Duonebs scheduled every 6 hr/ PRN SOB/Wheezing  - mucinex -  Social work consult  Acute respiratory failure with hypoxia:  O2 sats on room air as low as 83% by report from EMS improved with 2 L of nasal cannula. - Continuous pulse oximetry with nasal cannula oxygen to keep O2 sats greater than 92%  Urinary tract infection: Acute. Patient with puslike discharge from urine. UA positive for rare bacteria, small leukocyte esterase, 6-30 WBCs. - Urine culture - Antibiotics as seen above   Generalized weakness - Physical therapy to eval and treat in am  Dementia - Continue Aricept, Risperdal  Essential hypertension - Continue Diovan    DVT prophylaxis:   Lovenox  Code Status: Full   Family Communication: Discussed plan with patient's daughter bedside Disposition Plan: Possible discharge home in 2-3 days Consults called:   none Admission status: Observation   Alyssa Braunondell A Licia Harl MD [facility] Pager [phone]  If 7PM-7AM, please contact night-coverage [email or web address] Password [email or web address]  11/27/2015, 4:10 PM

## 2015-11-28 DIAGNOSIS — Z66 Do not resuscitate: Secondary | ICD-10-CM | POA: Diagnosis present

## 2015-11-28 DIAGNOSIS — I10 Essential (primary) hypertension: Secondary | ICD-10-CM | POA: Diagnosis not present

## 2015-11-28 DIAGNOSIS — J189 Pneumonia, unspecified organism: Secondary | ICD-10-CM | POA: Diagnosis not present

## 2015-11-28 DIAGNOSIS — N390 Urinary tract infection, site not specified: Secondary | ICD-10-CM | POA: Diagnosis not present

## 2015-11-28 DIAGNOSIS — R531 Weakness: Secondary | ICD-10-CM | POA: Diagnosis not present

## 2015-11-28 DIAGNOSIS — E785 Hyperlipidemia, unspecified: Secondary | ICD-10-CM | POA: Diagnosis present

## 2015-11-28 DIAGNOSIS — E43 Unspecified severe protein-calorie malnutrition: Secondary | ICD-10-CM | POA: Diagnosis not present

## 2015-11-28 DIAGNOSIS — H3530 Unspecified macular degeneration: Secondary | ICD-10-CM | POA: Diagnosis present

## 2015-11-28 DIAGNOSIS — J9601 Acute respiratory failure with hypoxia: Secondary | ICD-10-CM | POA: Diagnosis present

## 2015-11-28 DIAGNOSIS — E7800 Pure hypercholesterolemia, unspecified: Secondary | ICD-10-CM | POA: Diagnosis present

## 2015-11-28 DIAGNOSIS — F0390 Unspecified dementia without behavioral disturbance: Secondary | ICD-10-CM | POA: Diagnosis present

## 2015-11-28 DIAGNOSIS — A419 Sepsis, unspecified organism: Secondary | ICD-10-CM | POA: Diagnosis present

## 2015-11-28 DIAGNOSIS — Z6820 Body mass index (BMI) 20.0-20.9, adult: Secondary | ICD-10-CM | POA: Diagnosis not present

## 2015-11-28 MED ORDER — PANTOPRAZOLE SODIUM 40 MG IV SOLR
40.0000 mg | INTRAVENOUS | Status: DC
  Administered 2015-11-28: 40 mg via INTRAVENOUS
  Filled 2015-11-28: qty 40

## 2015-11-28 MED ORDER — DEXTROSE 5 % IV SOLN
INTRAVENOUS | Status: DC
  Administered 2015-11-28: 09:00:00 via INTRAVENOUS

## 2015-11-28 MED ORDER — ENOXAPARIN SODIUM 40 MG/0.4ML ~~LOC~~ SOLN
40.0000 mg | SUBCUTANEOUS | Status: DC
  Administered 2015-11-28: 40 mg via SUBCUTANEOUS
  Filled 2015-11-28: qty 0.4

## 2015-11-28 NOTE — Care Management Note (Addendum)
Case Management Note  Patient Details  Name: Alyssa Jacobson MRN: [id number] Date of Birth: 08/27/1922  Subjective/Objective: To room to speak with patient , Patient is not able to answer questions.  Grand daughter  Alyssa Jacobson([name]) is in room and able to give history . Patient is from home with her daughter Alyssa Jacobson([name]) Patient has W/C, HSP Bed, BSC, Grand daughter stated that it is getting harder for family to take care of her due to increasing dementia. Patient has 24 hour paid sitters.  Will need PT evaluation, Question is whether patient is skillable given dementia.  Harder to transfer at home. Patient has had [facility] in past and family stated that if needed this agency would be their choice.  More to follow.   Spoke with Abby at [facility].  Patient was open to [facility] prior to admission.  Will need Resumption order at discharge.       Action/Plan: To be determined.    Expected Discharge Date:                  Expected Discharge Plan:  Home w Home Health Services  In-House Referral:     Discharge planning Services  CM Consult  Post Acute Care Choice:  Resumption of Svcs/PTA Provider Choice offered to:     DME Arranged:    DME Agency:     HH Arranged:    HH Agency:  [facility]  Status of Service:  In process, will continue to follow  Medicare Important Message Given:    Date Medicare IM Given:    Medicare IM give by:    Date Additional Medicare IM Given:    Additional Medicare Important Message give by:     If discussed at Long Length of Stay Meetings, dates discussed:    Additional Comments:  Adonis HugueninBerkhead, Ronesha Heenan L, RN 11/28/2015, 8:44 AM

## 2015-11-28 NOTE — Clinical Social Work Note (Signed)
CSW received referral stating that pt has 24 hr caregiver and home health services. CM notified. CSW signing off, but can be reconsulted if needed.  Derenda FennelKara Kytzia Gienger, LCSW [phone]

## 2015-11-28 NOTE — Care Management Obs Status (Signed)
MEDICARE OBSERVATION STATUS NOTIFICATION   Patient Details  Name: Alyssa Jacobson MRN: [id number] Date of Birth: 02/12/1923   Medicare Observation Status Notification Given:  Yes    Adonis HugueninBerkhead, Kelan Pritt L, RN 11/28/2015, 8:56 AM

## 2015-11-28 NOTE — Evaluation (Signed)
Clinical/Bedside Swallow Evaluation Patient Details  Name: Alyssa Jacobson MRN: [id number] Date of Birth: 05/01/1923  Today's Date: 11/28/2015 Time: SLP Start Time (ACUTE ONLY): 1900 SLP Stop Time (ACUTE ONLY): 1927 SLP Time Calculation (min) (ACUTE ONLY): 27 min  Past Medical History:  Past Medical History  Diagnosis Date  . Macular degeneration of both eyes   . Hypertension   . High cholesterol   . Hip fracture (HCC)   . Ankle fracture   . Dementia    Past Surgical History:  Past Surgical History  Procedure Laterality Date  . Femur im nail Right 05/03/2015    Procedure: INTRAMEDULLARY (IM) NAIL FEMORAL;  Surgeon: Sheral Apley, MD;  Location: [facility] OR;  Service: Orthopedics;  Laterality: Right;   HPI:  Alyssa Jacobson is a 82 y.o. female with medical history significant of HTN, HLD, dementia; who presents with complaints of weakness. History is obtained from the patient's daughter as the patient has dementia and is unable to provide her own at this time. At baseline the patient has dementia, but was able to ambulate with assistance of a walker. She lives in her own home and has a 24/7 caregiver. Approximately 2 weeks or more ago the patient developed a cough with clear stringy sputum. She had been evaluated by one of the [facility] nurses that comes out once or twice a week. At that time it was thought that the patient may have a 50-50 chance of pneumonia for which she was treated with what was thought to be amoxicillin. After the course of amoxicillin patient still was noted to have cough and family felt that they could hear her intermittently wheeze. Family called for the nurse to calm back out and check on her. At that time was thought that she may have a low-grade fever, but was not given any additional antibiotics at that time. Sputum production of cough changed to color during this interval. Physical therapy came out 4 days ago and noticed that the patient was weaker than  normal. The nurse came back out to check on her approximately 3 days ago and they checked a urine sample due to her caregiver noticing a puslike discharge while cleaning her.The family took the sample to the doctor's office. Dr. Margo Aye had written a prescription for ciprofloxacin which the patient started that same day. 2 days ago family notes that they were unable to get her to stand to clean and change her. Daughter states over this last week that there is been a progressive decline in her cognitive status and she has been less communicative. Associated symptoms include complaints of generalized abdominal pain, weight loss of approximately 5 pounds, and decreased appetite. Patient has not been eating and drinking. Family also notes that she had just set down in the middle for at least twice in the last week. They deny any falls, nausea vomiting   Assessment / Plan / Recommendation Clinical Impression  Ms. Mole was seen at bedside with her sister and brother-in-law present. The provided some background information, stating that pt has 24 hour caregivers. She wonders if her sister might do better if she is offered softer foods rather than hot dogs and hamburgers (given at home). Pt alert and agreeable. Oral care completed and tongue noted to be dry. Swallow initiation palpated during trials of ice chips, water via cup sips and straw sips, puree, and mech soft. Pt with mildly prolonged oral transit with suspected delay in swallow initiation, but airway protection appeared adequate.  Pt with one episode of delayed coughing after po trials. Recommend D2/chopped with thin liquids via cup or straw when pt is alert and upright. Encourage small bites/sips and po meds presented crushed as able in puree. Pt very alert this evening and apparently this was not the case earlier in the day. Recommend witholding po if pt unable to sustain appropriate level of alertness for safe po intake. SLP will follow while in acute  setting for diet tolerance and upgrades as appropriate.    Aspiration Risk  Mild aspiration risk    Diet Recommendation Dysphagia 2 (Fine chop);Thin liquid   Liquid Administration via: Cup;Straw Medication Administration: Crushed with puree Supervision: Staff to assist with self feeding;Full supervision/cueing for compensatory strategies Compensations: Minimize environmental distractions;Slow rate;Small sips/bites (ensure clear oral cavity) Postural Changes: Seated upright at 90 degrees;Remain upright for at least 30 minutes after po intake    Other  Recommendations Oral Care Recommendations: Oral care BID;Staff/trained caregiver to provide oral care Other Recommendations: Clarify dietary restrictions   Follow up Recommendations  Skilled Nursing facility    Frequency and Duration min 2x/week  1 week       Prognosis Prognosis for Safe Diet Advancement: Fair Barriers to Reach Goals: Cognitive deficits      Swallow Study   General Date of Onset: 11/27/15 HPI: Alyssa Jacobson is a 82 y.o. female with medical history significant of HTN, HLD, dementia; who presents with complaints of weakness. History is obtained from the patient's daughter as the patient has dementia and is unable to provide her own at this time. At baseline the patient has dementia, but was able to ambulate with assistance of a walker. She lives in her own home and has a 24/7 caregiver. Approximately 2 weeks or more ago the patient developed a cough with clear stringy sputum. She had been evaluated by one of the [facility] nurses that comes out once or twice a week. At that time it was thought that the patient may have a 50-50 chance of pneumonia for which she was treated with what was thought to be amoxicillin. After the course of amoxicillin patient still was noted to have cough and family felt that they could hear her intermittently wheeze. Family called for the nurse to calm back out and check on her. At that  time was thought that she may have a low-grade fever, but was not given any additional antibiotics at that time. Sputum production of cough changed to color during this interval. Physical therapy came out 4 days ago and noticed that the patient was weaker than normal. The nurse came back out to check on her approximately 3 days ago and they checked a urine sample due to her caregiver noticing a puslike discharge while cleaning her.The family took the sample to the doctor's office. Dr. Margo Aye had written a prescription for ciprofloxacin which the patient started that same day. 2 days ago family notes that they were unable to get her to stand to clean and change her. Daughter states over this last week that there is been a progressive decline in her cognitive status and she has been less communicative. Associated symptoms include complaints of generalized abdominal pain, weight loss of approximately 5 pounds, and decreased appetite. Patient has not been eating and drinking. Family also notes that she had just set down in the middle for at least twice in the last week. They deny any falls, nausea vomiting Type of Study: Bedside Swallow Evaluation Previous Swallow Assessment: none on  record Diet Prior to this Study: NPO Temperature Spikes Noted: No (febrile to 102 yesterday) Respiratory Status: Nasal cannula History of Recent Intubation: No Behavior/Cognition: Alert;Cooperative;Pleasant mood;Requires cueing Oral Cavity Assessment: Within Functional Limits;Dry Oral Care Completed by SLP: Yes Oral Cavity - Dentition: Dentures, bottom;Dentures, top Vision: Impaired for self-feeding Self-Feeding Abilities: Needs assist Patient Positioning: Upright in bed Baseline Vocal Quality: Normal Volitional Cough: Weak;Congested Volitional Swallow: Able to elicit    Oral/Motor/Sensory Function Overall Oral Motor/Sensory Function: Mild impairment Facial ROM: Within Functional Limits Facial Symmetry: Within Functional  Limits Facial Strength: Within Functional Limits Facial Sensation: Within Functional Limits Lingual ROM: Within Functional Limits Lingual Symmetry: Within Functional Limits Lingual Strength: Reduced Lingual Sensation: Within Functional Limits Velum: Within Functional Limits Mandible: Within Functional Limits   Ice Chips Ice chips: Within functional limits Presentation: Spoon   Thin Liquid Thin Liquid: Within functional limits Presentation: Cup;Straw    Nectar Thick Nectar Thick Liquid: Not tested   Honey Thick Honey Thick Liquid: Not tested   Puree Puree: Within functional limits Presentation: Spoon   Solid   Thank you,  Havery MorosDabney Susannah Carbin, CCC-SLP [phone]    Solid:  (prolonged oral transit) Presentation: Spoon        Audery Wassenaar 11/28/2015,7:48 PM

## 2015-11-28 NOTE — Progress Notes (Signed)
PROGRESS NOTE    Alyssa Jacobson  WRU:[id number] DOB: September 28, 1922 DOA: 11/27/2015 PCP: Dwana Melena, MD    Brief Narrative:  82 y.o. female with medical history significant of HTN, HLD, dementia; who presents with complaints of weakness. In the ED, patient noted to havea temperature up to 100F, respirations up to 27, O2 saturations as low as 83% on room air improved to 97% on 2 L nasal cannula oxygen. Initial lab work revealed a WBC of 10.9, troponin I 0.03, lactic acid of 1.5, and BMP is otherwise unremarkable. UA was positive for rare bacteria, small leukocytes, and 6-30 WBCs. Chest x-ray showed a left basilar infiltrate.   Assessment & Plan:   Principal Problem:   CAP (community acquired pneumonia) Active Problems:   Hypertension   UTI (lower urinary tract infection)   Acute respiratory failure with hypoxia (HCC)   Generalized weakness   Dementia   Protein-calorie malnutrition, severe   Suspected sepsis secondary to a CAP(community acquired pneumonia): Acute. Patient noted to have a cough for at least 2 weeks prior to admission. Patient with temperature up to 100F, pulse up to 105, and respiratory rate up to 27 on presentation. Previously treated with Augmentin without relief of symptoms. Chest x-ray showed a left basilar infiltrate. Patient was placed on Rocephin and ceftriaxone in the ED. - Continue empiric antibiotics of Rocephin and azithromycin - Fever control prn tylenol - Repeat CBC in am - Sputum cultures - Blood cultures - Duonebs scheduled every 6 hr/ PRN SOB/Wheezing   Acute respiratory failure with hypoxia: O2 sats on room air as low as 83% by report from EMS improved with 2 L of nasal cannula. - Continuous pulse oximetry with nasal cannula oxygen to keep O2 sats greater than 92%  Urinary tract infection: Acute. Patient with puslike discharge from urine. UA positive for rare bacteria, small leukocyte esterase, 6-30 WBCs. - Urine culture - Antibiotics as seen  above   Generalized weakness - Physical therapy to eval and treat in am  Dementia - Continue Aricept, Risperdal  Essential hypertension - Continue Diovan   DVT prophylaxis: Lovenox subcutaneous Code Status: Full Family Communication: Patient in room, family at bedside Disposition Plan: Uncertain at this time   Consultants:     Procedures:     Antimicrobials:    Anti-infectives    Start     Dose/Rate Route Frequency Ordered Stop   11/28/15 1200  cefTRIAXone (ROCEPHIN) 1 g in dextrose 5 % 50 mL IVPB     1 g 100 mL/hr over 30 Minutes Intravenous Every 24 hours 11/27/15 1619 12/04/15 1159   11/28/15 1200  azithromycin (ZITHROMAX) 500 mg in dextrose 5 % 250 mL IVPB     500 mg 250 mL/hr over 60 Minutes Intravenous Every 24 hours 11/27/15 1619 12/04/15 1159   11/27/15 1500  azithromycin (ZITHROMAX) 500 mg in dextrose 5 % 250 mL IVPB     500 mg 250 mL/hr over 60 Minutes Intravenous  Once 11/27/15 1458 11/27/15 1610   11/27/15 1445  azithromycin (ZITHROMAX) tablet 500 mg  Status:  Discontinued     500 mg Oral  Once 11/27/15 1430 11/27/15 1457   11/27/15 1430  cefTRIAXone (ROCEPHIN) 1 g in dextrose 5 % 50 mL IVPB     1 g 100 mL/hr over 30 Minutes Intravenous  Once 11/27/15 1430 11/27/15 1537        Subjective: Unable to assess, patient lethargic this morning  Objective: Filed Vitals:   11/27/15 2215 11/28/15 0134 11/28/15  [id number] 11/28/15 0744  BP: 106/57  102/49   Pulse: 105  102   Temp: 98 F (36.7 C)  98.2 F (36.8 C)   TempSrc: Axillary  Oral   Resp: 16  16   Height:      Weight:      SpO2: 96% 96% 99% 100%   No intake or output data in the 24 hours ending 11/28/15 1206 Filed Weights   11/27/15 1138 11/27/15 1713  Weight: 54.432 kg (120 lb) 53.6 kg (118 lb 2.7 oz)    Examination:  General exam: Appears calm and comfortable, Lying in bed, lethargic  Respiratory system: Clear to auscultation. Respiratory effort normal. Cardiovascular system: S1 & S2  heard, RRR. Gastrointestinal system: Abdomen is nondistended, soft and nontender. No organomegaly or masses felt. Normal bowel sounds heard. Central nervous system: Alert and oriented. No focal neurological deficits. Extremities: Symmetric 5 x 5 power. Skin: No rashes, lesions Psychiatry: Difficult to assess as patient is currently lethargic    Data Reviewed: I have personally reviewed following labs and imaging studies  CBC:  Recent Labs Lab 11/27/15 1200  WBC 10.9*  NEUTROABS 8.1*  HGB 12.0  HCT 38.3  MCV 98.7  PLT 245   Basic Metabolic Panel:  Recent Labs Lab 11/27/15 1200  NA 135  K 4.2  CL 99*  CO2 29  GLUCOSE 116*  BUN 19  CREATININE 0.87  CALCIUM 8.9   GFR: Estimated Creatinine Clearance: 35.6 mL/min (by C-G formula based on Cr of 0.87). Liver Function Tests:  Recent Labs Lab 11/27/15 1259  AST 18  ALT 10*  ALKPHOS 47  BILITOT 0.7  PROT 6.7  ALBUMIN 3.0*    Recent Labs Lab 11/27/15 1259  LIPASE 17   No results for input(s): AMMONIA in the last 168 hours. Coagulation Profile: No results for input(s): INR, PROTIME in the last 168 hours. Cardiac Enzymes:  Recent Labs Lab 11/27/15 1259  TROPONINI 0.03   BNP (last 3 results) No results for input(s): PROBNP in the last 8760 hours. HbA1C: No results for input(s): HGBA1C in the last 72 hours. CBG: No results for input(s): GLUCAP in the last 168 hours. Lipid Profile: No results for input(s): CHOL, HDL, LDLCALC, TRIG, CHOLHDL, LDLDIRECT in the last 72 hours. Thyroid Function Tests: No results for input(s): TSH, T4TOTAL, FREET4, T3FREE, THYROIDAB in the last 72 hours. Anemia Panel: No results for input(s): VITAMINB12, FOLATE, FERRITIN, TIBC, IRON, RETICCTPCT in the last 72 hours. Sepsis Labs:  Recent Labs Lab 11/27/15 1259 11/27/15 1645  LATICACIDVEN 1.5 1.7    No results found for this or any previous visit (from the past 240 hour(s)).       Radiology Studies: Dg Chest  Portable 1 View  11/27/2015  CLINICAL DATA:  Shortness of breath and cough EXAM: PORTABLE CHEST 1 VIEW COMPARISON:  05/02/2015 FINDINGS: Cardiac shadow is stable. Elevation of left hemidiaphragm is again seen. Left basilar infiltrate is noted. The right lung is clear. No bony abnormality is seen. IMPRESSION: Left basilar infiltrate. Electronically Signed   By: Alcide CleverMark  Lukens M.D.   On: 11/27/2015 12:26   Dg Foot Complete Right  11/27/2015  CLINICAL DATA:  Right foot pain, no known injury, initial encounter EXAM: RIGHT FOOT COMPLETE - 3+ VIEW COMPARISON:  None. FINDINGS: Generalized osteopenia is noted. No bony erosion is identified. No acute fracture dislocation is seen. IMPRESSION: No acute abnormality noted. Electronically Signed   By: Alcide CleverMark  Lukens M.D.   On: 11/27/2015 12:27  Scheduled Meds: . antiseptic oral rinse  7 mL Mouth Rinse BID  . azithromycin  500 mg Intravenous Q24H  . calcium-vitamin D  1 tablet Oral Daily  . cefTRIAXone (ROCEPHIN)  IV  1 g Intravenous Q24H  . donepezil  5 mg Oral Daily  . enoxaparin (LOVENOX) injection  40 mg Subcutaneous Q24H  . folic acid  1 mg Oral Daily  . guaiFENesin  600 mg Oral BID  . ipratropium-albuterol  3 mL Nebulization Q6H  . irbesartan  300 mg Oral Daily  . multivitamin-lutein  1 capsule Oral Daily  . pantoprazole  40 mg Oral Daily  . risperiDONE  1 mg Oral QHS  . vitamin C  250 mg Oral Daily   Continuous Infusions: . dextrose 75 mL/hr at 11/28/15 0921      Calahan Pak, Scheryl Marten, MD [facility] Pager [phone]  If 7PM-7AM, please contact night-coverage [email or web address] Password [email or web address] 11/28/2015, 12:06 PM

## 2015-11-28 NOTE — Clinical Social Work Note (Signed)
Clinical Social Work Assessment  Patient Details  Name: Alyssa Jacobson MRN: [id number] Date of Birth: 16-Dec-1922  Date of referral:  11/28/15               Reason for consult:  Discharge Planning                Permission sought to share information with:    Permission granted to share information::     Name::        Agency::     Relationship::     Contact Information:     Housing/Transportation Living arrangements for the past 2 months:  Single Family Home Source of Information:  Adult Children Patient Interpreter Needed:  None Criminal Activity/Legal Involvement Pertinent to Current Situation/Hospitalization:  No - Comment as needed Significant Relationships:  Adult Children Lives with:  Self Do you feel safe going back to the place where you live?  Yes Need for family participation in patient care:  Yes (Comment)  Care giving concerns:  Pt's daughter is concerned about ability to manage at home in current condition.    Social Worker assessment / plan:  CSW met with pt's daughter, Alyssa Jacobson at bedside who reports she is Economist. Pt sleeping during assessment. Alyssa Jacobson shared that pt lives at home with 24/7 paid caregiver support. Alyssa Jacobson lives locally, and her brother is out of town. Pt has been managing fairly well at home and ambulates with a walker. Her daughter notes that her dementia has progressed in the last few months. Last week, her care became much more difficult and over the weekend, they were unable to get pt out of bed. Pt admitted due to pneumonia. PT evaluated pt and recommend SNF. CSW discussed placement process, including insurance authorization. Daughter requests PNC if possible. Pt went to SNF for about one month at the end of last year. Alyssa Jacobson also requested to speak to MD about code status and indicates that they want DNR. MD notified.   Employment status:  Retired Nurse, adult PT Recommendations:  [place] / Referral to community resources:  [place]  Patient/Family's Response to care:  Pt's daughter feels SNF will be best option at d/c.   Patient/Family's Understanding of and Emotional Response to Diagnosis, Current Treatment, and Prognosis:  Pt's daughter is aware of admission diagnosis and treatment plan and appears to be knowledgeable about pt's health history.      Emotional Assessment Appearance:  Appears stated age Attitude/Demeanor/Rapport:  Unable to Assess Affect (typically observed):  Unable to Assess Orientation:  Oriented to Self Alcohol / Substance use:  Not Applicable Psych involvement (Current and /or in the community):  No (Comment)  Discharge Needs  Concerns to be addressed:  Discharge Planning Concerns Readmission within the last 30 days:  No Current discharge risk:  Physical Impairment Barriers to Discharge:  Continued Medical Work up   Salome Arnt, [place] 11/28/2015, 4:18 PM [phone]

## 2015-11-28 NOTE — Consult Note (Signed)
   [facility]   11/28/2015  Aretha Parrotudrey R Hunkele 05/15/1923 [id number]  [facility] for chronic disease management services.  Patient has been engaged by a [employer] Community Care Coordinator and LCSW.  Our community based plan of care has focused on disease management and community resource support.  Patient will receive a post discharge transition of care call and will be evaluated for monthly home visits for assessments and disease process education.  Made Inpatient Case Manager aware that [facility] following.  Of note, [facility] Care [facility] services does not replace or interfere with any services that are arranged by inpatient case management or social work.    For additional questions or referrals please contact:   Alben SpittleMary E. Albertha GheeNiemczura, RN, BSN, [facility]  Hospital Liaison [facility] [phone]) Business Cell  [phone]) Toll Free Office

## 2015-11-28 NOTE — Evaluation (Signed)
Physical Therapy Evaluation Patient Details Name: Alyssa Jacobson MRN: [id number] DOB: 05/15/1923 Today's Date: 11/28/2015   History of Present Illness  82 yo F admitted due to PNA, UTI, and sepsis.  PMH: Macular degeneration of both eyes, HTN, R Hip fx s/p IM nail 2016, Ankle fx, dementia.  Clinical Impression  Pt received in bed, son, dtr-in-law, and dtr all present, and pt was agreeable to PT evaluation.  PLOF obtained for pt's dtr, who states that she was ambulating short distances with RW and assistance up until ~1week ago.  Pt also required assistance for ADL's and IADL's.  Pt has 24/7 caregivers, however they are no longer able to give the level of assistance that she is requiring at this time.  During today's PT evaluation, she required Max A for supine<>sit transfer, max A for sit<>stand transfer with RW, and Max A for sit<>supine transfer.  Pt demonstrates poor initiation of mobility, and strong posterior lean when in standing.  At this point, she is recommended for SNF due to need for higher level of care than able to be provided by caregivers at this time.     Follow Up Recommendations SNF    Equipment Recommendations  None recommended by PT    Recommendations for Other Services       Precautions / Restrictions Precautions Precautions: Fall Precaution Comments: Dtr reports that over the past 2 weeks there have been 2 instances where she has just "sat down" on the floor.  Restrictions Weight Bearing Restrictions: No      Mobility  Bed Mobility Overal bed mobility: Needs Assistance Bed Mobility: Supine to Sit;Sit to Supine     Supine to sit: Max assist;HOB elevated (bed pad used to assist pt's hips to the EOB.  Pt not able to follow commands to use UE's to assist with pushing. ) Sit to supine: Max assist      Transfers Overall transfer level: Needs assistance Equipment used: Rolling walker (2 wheeled) Transfers: Sit to/from Stand Sit to Stand: Max assist          General transfer comment: Pt demonstrates posterior lean in standing, but able to shift weight anteriorly after cues for looking fwd, and assisting to move RW fwd.  Pt also needed increased time and cues to get hands placed correctly on the RW.  Once standing, she requires Min guard/Min A.   Ambulation/Gait Ambulation/Gait assistance:  (NA due to level of assistance pt required for bed mobility and transfers. )              Stairs            Wheelchair Mobility    Modified Rankin (Stroke Patients Only)       Balance Overall balance assessment: Needs assistance Sitting-balance support: Bilateral upper extremity supported Sitting balance-Leahy Scale: Fair   Postural control: Posterior lean Standing balance support: Bilateral upper extremity supported Standing balance-Leahy Scale: Fair                               Pertinent Vitals/Pain Pain Assessment: No/denies pain    Home Living   Living Arrangements: Non-relatives/Friends Available Help at Discharge: Personal care attendant (Pt has 24/7 caregivers) Type of Home: House       Home Layout: One level Home Equipment: Walker - 2 wheels;Hospital bed;Bedside commode      Prior Function Level of Independence: Needs assistance   Gait / Transfers Assistance Needed: Pt ambulates  with a RW and assistance for a distance of ~12ft, which allows her to get from her bed to the bathroom.    ADL's / Homemaking Assistance Needed: Pt requires assistance for dressing and bathing.         Hand Dominance   Dominant Hand: Right    Extremity/Trunk Assessment   Upper Extremity Assessment: Generalized weakness           Lower Extremity Assessment: Generalized weakness      Cervical / Trunk Assessment: Kyphotic  Communication   Communication: HOH  Cognition Arousal/Alertness: Lethargic Behavior During Therapy: Flat affect Overall Cognitive Status: History of cognitive impairments - at baseline                       General Comments      Exercises        Assessment/Plan    PT Assessment Patient needs continued PT services  PT Diagnosis Difficulty walking;Abnormality of gait;Generalized weakness   PT Problem List Decreased strength;Decreased activity tolerance;Decreased knowledge of precautions;Decreased balance;Decreased mobility;Decreased cognition;Decreased knowledge of use of DME;Decreased safety awareness;Cardiopulmonary status limiting activity  PT Treatment Interventions DME instruction;Stair training;Functional mobility training;Therapeutic activities;Therapeutic exercise;Balance training;Patient/family education   PT Goals (Current goals can be found in the Care Plan section) Acute Rehab PT Goals Patient Stated Goal: Pt wants to move like she was moving ~1week ago.  PT Goal Formulation: With patient/family Time For Goal Achievement: 12/12/15 Potential to Achieve Goals: Fair    Frequency Min 4X/week   Barriers to discharge Decreased caregiver support Dtr states that it has become too much for the caregivers to care for her at home, and caregivers are no longer able to give the amount of assist pt requires.      Co-evaluation               End of Session Equipment Utilized During Treatment: Gait belt;Oxygen Activity Tolerance: Patient limited by fatigue Patient left: in bed;with family/visitor present;with bed alarm set;with nursing/sitter in room Nurse Communication: Mobility status    Functional Assessment Tool Used: [employer] AM-PAC "6-clicks"  Functional Limitation: Mobility: Walking and moving around Mobility: Walking and Moving Around Current Status [phone]): At least 60 percent but less than 80 percent impaired, limited or restricted Mobility: Walking and Moving Around Goal Status [phone]): At least 40 percent but less than 60 percent impaired, limited or restricted    Time: 1345-1423 PT Time Calculation (min) (ACUTE ONLY): 38  min   Charges:   PT Evaluation $PT Eval Moderate Complexity: 1 Procedure PT Treatments $Therapeutic Activity: 23-37 mins   PT G Codes:   PT G-Codes **NOT FOR INPATIENT CLASS** Functional Assessment Tool Used: [employer] "6-clicks"  Functional Limitation: Mobility: Walking and moving around Mobility: Walking and Moving Around Current Status [phone]): At least 60 percent but less than 80 percent impaired, limited or restricted Mobility: Walking and Moving Around Goal Status [phone]): At least 40 percent but less than 60 percent impaired, limited or restricted    Carollee Herter, PT, DPT X: [phone]   11/28/2015, 4:29 PM

## 2015-11-28 NOTE — Plan of Care (Signed)
Problem: Activity: Goal: Ability to tolerate increased activity will improve Outcome: Progressing Pt is to see patient   Problem: Respiratory: Goal: Respiratory status will improve Outcome: Progressing O2 stats have dropped in the 70's at times. Now stable at 98%

## 2015-11-28 NOTE — Clinical Social Work Placement (Signed)
   CLINICAL SOCIAL WORK PLACEMENT  NOTE  Date:  11/28/2015  Patient Details  Name: [name] MRN: [id number] Date of Birth: 08/05/1922  Clinical Social Work is seeking post-discharge placement for this patient at the Skilled  Nursing Facility level of care (*CSW will initial, date and re-position this form in  chart as items are completed):  Yes   Patient/family provided with [facility] Clinical Social Work Department's list of facilities offering this level of care within the geographic area requested by the patient (or if unable, by the patient's family).  Yes   Patient/family informed of their freedom to choose among providers that offer the needed level of care, that participate in Medicare, Medicaid or managed care program needed by the patient, have an available bed and are willing to accept the patient.  Yes   Patient/family informed of [facility]'s ownership interest in [facility] and [facility], as well as of the fact that they are under no obligation to receive care at these facilities.  PASRR submitted to EDS on       PASRR number received on       Existing PASRR number confirmed on 11/28/15     FL2 transmitted to all facilities in geographic area requested by pt/family on 11/28/15     FL2 transmitted to all facilities within larger geographic area on       Patient informed that his/her managed care company has contracts with or will negotiate with certain facilities, including the following:            Patient/family informed of bed offers received.  Patient chooses bed at       Physician recommends and patient chooses bed at      Patient to be transferred to   on  .  Patient to be transferred to facility by       Patient family notified on   of transfer.  Name of family member notified:        PHYSICIAN       Additional Comment:    _______________________________________________ Karn CassisStultz, Keiran Sias [place]ger, LCSW 11/28/2015, 4:11  PM [phone]

## 2015-11-28 NOTE — Progress Notes (Signed)
Initial Nutrition Assessment  DOCUMENTATION CODES:   Severe malnutrition in context of social or environmental circumstances  INTERVENTION:  Ensure Enlive TID. Each supplement provides 350 kcals and 20 grams of protein.   NUTRITION DIAGNOSIS:   Inadequate oral intake related to poor appetite as evidenced by per patient/family report.  GOAL:   Patient will meet greater than or equal to 90% of their needs  MONITOR:   PO intake, Supplement acceptance, Labs, Weight trends, Skin, I & O's  REASON FOR ASSESSMENT:   Malnutrition Screening Tool    ASSESSMENT:   Pt with medical history significant of HTN, HLD, dementia; who presents with complaints of weakness.Unable to provide her own history at this time. At baseline the patient has dementia, but was able to ambulate with assistance of a walker. She lives in her own home and has a 24/7 caregiver. Approximately 2 weeks or more ago the patient developed a cough with clear stringy sputum. Weight loss of 5 lbs, abdominal pain, and poor appetite, per daughter.    Pt asleep at time of visit. Spoke to pt's granddaughter. Pt has had a decline in appetite and intake recently (daughter could not specify time frame). Pt complains of abdominal pain PTA. Pt has lost 9.9% body weight in 7 months. This is not significant for time frame. Pt drinks Ensure at home. Will monitor diet advancement and order Ensure TID when appropriate.   NFPE: reveals severe muscle and fat depletion, no edema. Pt severely malnourished. Suspect d/t advanced age/normal aging process.  Labs reviewed; Cl 99, GFR 57, glucose 116.  Meds reviewed; Ca-D, folic acid, vitamin C.  Diet Order:  Diet NPO time specified  Skin:  Reviewed, no issues  Last BM:  5/30  Height:   Ht Readings from Last 1 Encounters:  11/27/15 5\' 3"  (1.6 m)    Weight:   Wt Readings from Last 1 Encounters:  11/27/15 118 lb 2.7 oz (53.6 kg)    Ideal Body Weight:  52.3 kg  BMI:  Body mass index  is 20.94 kg/(m^2).  Estimated Nutritional Needs:   Kcal:  1200-1400 kcals   Protein:  60-70 g   Fluid:  1.2-1.4 L  EDUCATION NEEDS:   No education needs identified at this time  Beryle QuantMeredith Jacaria Colburn, MS [facility] Dietetic Intern Pager [phone]

## 2015-11-28 NOTE — Progress Notes (Signed)
Met with pt's daughter who is POA. Pt does have a living will and it is confirmed through family that patient's wishes are for DNR/DNI. Daughter is aware that "she could go anytime." DNR orders placed.

## 2015-11-28 NOTE — Progress Notes (Signed)
Patient c/o head and neck pain. Repositioned for comfort. Family at bedside. Daughter stated she takes tylenol as needed for pain at home. Pt is alert to self, responds to voice. Daughter and son reported she has had some recent difficulty swallowing at times but takes medications in applesauce. Notified Dr.Chiu. Stated okay to try tylenol crushed in applesauce for head/neck pain. Pt tolerated well, refused further applesauce after tylenol. ST consult placed per MD. MD stated okay to hold other morning meds until patient more alert. Earnstine RegalAshley Cashe Gatt, RN

## 2015-11-29 ENCOUNTER — Inpatient Hospital Stay
Admission: RE | Admit: 2015-11-29 | Discharge: 2015-12-19 | Disposition: A | Discharge status: Home / Self Care | Payer: PPO | Source: Ambulatory Visit | Attending: Internal Medicine | Admitting: Internal Medicine

## 2015-11-29 ENCOUNTER — Other Ambulatory Visit: Payer: Self-pay | Admitting: *Deleted

## 2015-11-29 DIAGNOSIS — E43 Unspecified severe protein-calorie malnutrition: Secondary | ICD-10-CM

## 2015-11-29 DIAGNOSIS — R531 Weakness: Secondary | ICD-10-CM

## 2015-11-29 DIAGNOSIS — J9601 Acute respiratory failure with hypoxia: Secondary | ICD-10-CM

## 2015-11-29 DIAGNOSIS — R52 Pain, unspecified: Secondary | ICD-10-CM

## 2015-11-29 DIAGNOSIS — Z66 Do not resuscitate: Secondary | ICD-10-CM | POA: Diagnosis present

## 2015-11-29 DIAGNOSIS — I10 Essential (primary) hypertension: Secondary | ICD-10-CM

## 2015-11-29 DIAGNOSIS — J189 Pneumonia, unspecified organism: Secondary | ICD-10-CM

## 2015-11-29 DIAGNOSIS — R609 Edema, unspecified: Secondary | ICD-10-CM

## 2015-11-29 DIAGNOSIS — R600 Localized edema: Principal | ICD-10-CM

## 2015-11-29 LAB — CBC
HEMATOCRIT: 30.4 % — AB (ref 36.0–46.0)
HEMOGLOBIN: 9.8 g/dL — AB (ref 12.0–15.0)
MCH: 31.8 pg (ref 26.0–34.0)
MCHC: 32.2 g/dL (ref 30.0–36.0)
MCV: 98.7 fL (ref 78.0–100.0)
Platelets: 225 10*3/uL (ref 150–400)
RBC: 3.08 MIL/uL — ABNORMAL LOW (ref 3.87–5.11)
RDW: 12.7 % (ref 11.5–15.5)
WBC: 8.7 10*3/uL (ref 4.0–10.5)

## 2015-11-29 LAB — URINE CULTURE: CULTURE: NO GROWTH

## 2015-11-29 LAB — BASIC METABOLIC PANEL
ANION GAP: 6 (ref 5–15)
BUN: 20 mg/dL (ref 6–20)
CALCIUM: 8.8 mg/dL — AB (ref 8.9–10.3)
CHLORIDE: 97 mmol/L — AB (ref 101–111)
CO2: 30 mmol/L (ref 22–32)
Creatinine, Ser: 0.63 mg/dL (ref 0.44–1.00)
GFR calc Af Amer: >60 mL/min (ref >60)
GFR calc non Af Amer: >60 mL/min (ref >60)
GLUCOSE: 131 mg/dL — AB (ref 65–99)
Potassium: 3.5 mmol/L (ref 3.5–5.1)
Sodium: 133 mmol/L — ABNORMAL LOW (ref 135–145)

## 2015-11-29 MED ORDER — IPRATROPIUM-ALBUTEROL 0.5-2.5 (3) MG/3ML IN SOLN: 3.0000 mL | Freq: Three times a day (TID) | RESPIRATORY_TRACT | Status: AC

## 2015-11-29 MED ORDER — GUAIFENESIN ER 600 MG PO TB12: 600.0000 mg | ORAL_TABLET | Freq: Two times a day (BID) | ORAL | Status: AC

## 2015-11-29 MED ORDER — CEFUROXIME AXETIL 500 MG PO TABS: 500.0000 mg | ORAL_TABLET | Freq: Two times a day (BID) | ORAL | Status: DC

## 2015-11-29 MED ORDER — ALPRAZOLAM 0.5 MG PO TABS: 0.5000 mg | ORAL_TABLET | Freq: Two times a day (BID) | ORAL | Status: AC | PRN

## 2015-11-29 MED ORDER — AZITHROMYCIN 500 MG PO TABS: 500.0000 mg | ORAL_TABLET | Freq: Every day | ORAL | Status: DC

## 2015-11-29 NOTE — Care Management Important Message (Addendum)
Important Message  Patient Details  Name: Alyssa Jacobson MRN: [id number] Date of Birth: 05/11/1923   Medicare Important Message Given:  Garnette CzechYES    Susann Lawhorne L, RN 11/29/2015, 2:22 PM

## 2015-11-29 NOTE — Patient Outreach (Signed)
[facility]) Care Management  11/29/2015  Alyssa Jacobson 05/02/1923 [id number]  Not[facility] Verdie DrownMary Niemczura RN, BSN, CCM that Mrs. Alyssa Jacobson will be discharged from the hospital today to short term skilled nursing facility care [facility].  Plan: I will follow Mrs. Fullard's progress and reach out to her/her daughter at home upon her discharge from [facility].    Marja Kayslisa Nahun Kronberg MHA,BSN,RN,CCM [facility] Care Management  [phone]

## 2015-11-29 NOTE — Discharge Summary (Signed)
Physician Discharge Summary  Alyssa Jacobson WUJ:[id number] DOB: 09/13/1922 DOA: 11/27/2015  PCP: Alyssa MelenaZack Hall, MD  Admit date: 11/27/2015 Discharge date: 11/29/2015  Time spent: 35 minutes  Recommendations for Outpatient Follow-up:  1. Discharge to SNF. 2. Follow-up with PCP in 1-2 weeks.  3. Valsartan discontinued due to blood pressures. Resume as outpatient.  4. Repeat CXR in 3-4 weeks to ensure resolution of pneumonia.   Discharge Diagnoses:  Principal Problem:   CAP (community acquired pneumonia) Active Problems:   Hypertension   Acute respiratory failure with hypoxia (HCC)   Generalized weakness   Dementia   Protein-calorie malnutrition, severe   Discharge Condition: Improved   Diet recommendation: Dysphagia 2; thin liquids  Filed Weights   11/27/15 1138 11/27/15 1713  Weight: 54.432 kg (120 lb) 53.6 kg (118 lb 2.7 oz)    History of present illness:  82 y.o. female with medical history significant of HTN, HLD and dementia presented with complaints of weakness. In the ED, patient noted to have fever up to 100F, respirations up to 27, O2 saturations as low as 83%. Initial lab work revealed a WBC of 10.9, lactic acid of 1.5, and BMP is otherwise unremarkable. UA was positive for UTI. Chest x-ray revealed a left basilar infiltrate.  Hospital Course:  Patient was admitted for possible sepsis, secondary to CAP. Chest x-ray on admission revealed a left basilar infiltrate. Patient admitted with fever, mild hypotension, and mild elevation of WBC. Fever and hypotension have resolved and WBC now WNL. Initially started on IV abx with rocephin and Zithromax. Clinically she has improved. On discharge transitioned to oral. ST has evaluated and notes she is at mild risk for aspiration. Dysphagia 2, thin liquid diet.  1. Acute respiratory failure with hypoxia. On 2L [place]. Continue to wean as tolerated..  2. Generalized weakness. PT recommended SNF on discharge.  3. Dementia. Continue  Aricept, Risperdal. 4. Essential HTN. Continue home medications.  Procedures:  None  Consultations:  None  Discharge Exam: Filed Vitals:   11/28/15 2100 11/29/15 0515  BP: 98/45 108/53  Pulse: 99 71  Temp: 98.2 F (36.8 C) 97.4 F (36.3 C)  Resp: 16 16     General: NAD  Cardiovascular: RRR, S1, S2   Respiratory: clear bilaterally, No wheezing, rales or rhonchi  Abdomen: soft, non tender, no distention , bowel sounds normal  Musculoskeletal: No edema b/l   Discharge Instructions   Discharge Instructions    Diet - low sodium heart healthy    Complete by:  As directed      Increase activity slowly    Complete by:  As directed           Current Discharge Medication List    START taking these medications   Details  azithromycin (ZITHROMAX) 500 MG tablet Take 1 tablet (500 mg total) by mouth daily. Qty: 5 tablet, Refills: 0    cefUROXime (CEFTIN) 500 MG tablet Take 1 tablet (500 mg total) by mouth 2 (two) times daily with a meal. Qty: 10 tablet, Refills: 0    guaiFENesin (MUCINEX) 600 MG 12 hr tablet Take 1 tablet (600 mg total) by mouth 2 (two) times daily.    ipratropium-albuterol (DUONEB) 0.5-2.5 (3) MG/3ML SOLN Take 3 mLs by nebulization 3 (three) times daily. Qty: 360 mL      CONTINUE these medications which have CHANGED   Details  ALPRAZolam (XANAX) 0.5 MG tablet Take 1 tablet (0.5 mg total) by mouth 2 (two) times daily as needed for anxiety.  Qty: 30 tablet, Refills: 0      CONTINUE these medications which have NOT CHANGED   Details  acetaminophen (TYLENOL) 325 MG tablet Take 2 tablets (650 mg total) by mouth every 6 (six) hours as needed for mild pain (or Fever >/= 101).    Ascorbic Acid (VITAMIN C PO) Take by mouth.    benzonatate (TESSALON) 100 MG capsule Take 200 mg by mouth 2 (two) times daily as needed for cough.    Calcium Carb-Cholecalciferol (CALCIUM 600 + D PO) Take 1 tablet by mouth 2 (two) times daily.    Cholecalciferol  (VITAMIN D-3 PO) Take by mouth.    folic acid (FOLVITE) 800 MCG tablet Take 800 mcg by mouth daily.    Multiple Vitamins-Minerals (PRESERVISION AREDS PO) Take 1 tablet by mouth daily.    omeprazole (PRILOSEC) 20 MG capsule Take 20 mg by mouth daily.    risperiDONE (RISPERDAL) 1 MG tablet Take 1 mg by mouth at bedtime.    donepezil (ARICEPT) 5 MG tablet Take 1 tablet by mouth daily.      STOP taking these medications     ciprofloxacin (CIPRO) 250 MG tablet      valsartan (DIOVAN) 320 MG tablet        Allergies  Allergen Reactions  . Sulfa Antibiotics Other (See Comments)    unknown      The results of significant diagnostics from this hospitalization (including imaging, microbiology, ancillary and laboratory) are listed below for reference.    Significant Diagnostic Studies: Dg Chest Portable 1 View  11/27/2015  CLINICAL DATA:  Shortness of breath and cough EXAM: PORTABLE CHEST 1 VIEW COMPARISON:  05/02/2015 FINDINGS: Cardiac shadow is stable. Elevation of left hemidiaphragm is again seen. Left basilar infiltrate is noted. The right lung is clear. No bony abnormality is seen. IMPRESSION: Left basilar infiltrate. Electronically Signed   By: Alcide Clever M.D.   On: 11/27/2015 12:26   Dg Foot Complete Right  11/27/2015  CLINICAL DATA:  Right foot pain, no known injury, initial encounter EXAM: RIGHT FOOT COMPLETE - 3+ VIEW COMPARISON:  None. FINDINGS: Generalized osteopenia is noted. No bony erosion is identified. No acute fracture dislocation is seen. IMPRESSION: No acute abnormality noted. Electronically Signed   By: Alcide Clever M.D.   On: 11/27/2015 12:27    Microbiology: Recent Results (from the past 240 hour(s))  Urine culture     Status: None   Collection Time: 11/27/15  1:30 PM  Result Value Ref Range Status   Specimen Description URINE, CLEAN CATCH  Final   Special Requests NONE  Final   Culture NO GROWTH Performed at [facility]   Final   Report Status  11/29/2015 FINAL  Final     Labs: Basic Metabolic Panel:  Recent Labs Lab 11/27/15 1200 11/29/15 0430  NA 135 133*  K 4.2 3.5  CL 99* 97*  CO2 29 30  GLUCOSE 116* 131*  BUN 19 20  CREATININE 0.87 0.63  CALCIUM 8.9 8.8*   Liver Function Tests:  Recent Labs Lab 11/27/15 1259  AST 18  ALT 10*  ALKPHOS 47  BILITOT 0.7  PROT 6.7  ALBUMIN 3.0*    Recent Labs Lab 11/27/15 1259  LIPASE 17   No results for input(s): AMMONIA in the last 168 hours. CBC:  Recent Labs Lab 11/27/15 1200 11/29/15 0430  WBC 10.9* 8.7  NEUTROABS 8.1*  --   HGB 12.0 9.8*  HCT 38.3 30.4*  MCV 98.7 98.7  PLT  245 225   Cardiac Enzymes:  Recent Labs Lab 11/27/15 1259  TROPONINI 0.03   BNP:    Signed:  Erick Blinks, MD [facility] 11/29/2015, 2:06 PM  By signing my name below, I, Zadie Cleverly, attest that this documentation has been prepared under the direction and in the presence of Erick Blinks, MD. Electronically signed: Zadie Cleverly, Scribe. 11/29/2015 1:45pm  I, Dr. Erick Blinks, personally performed the services described in this documentaiton. All medical record entries made by the scribe were at my direction and in my presence. I have reviewed the chart and agree that the record reflects my personal performance and is accurate and complete  Erick Blinks, MD, 11/29/2015 2:06 PM

## 2015-11-29 NOTE — Clinical Social Work Placement (Signed)
   CLINICAL SOCIAL WORK PLACEMENT  NOTE  Date:  11/29/2015  Patient Details  Name: Alyssa Jacobson MRN: [id number] Date of Birth: 09/22/1922  Clinical Social Work is seeking post-discharge placement for this patient at the Skilled  Nursing Facility level of care (*CSW will initial, date and re-position this form in  chart as items are completed):  Yes   Patient/family provided with [place] Clinical Social Work Department's list of facilities offering this level of care within the geographic area requested by the patient (or if unable, by the patient's family).  Yes   Patient/family informed of their freedom to choose among providers that offer the needed level of care, that participate in Medicare, Medicaid or managed care program needed by the patient, have an available bed and are willing to accept the patient.  Yes   Patient/family informed of [facility]'s ownership interest in [facility] and [facility], as well as of the fact that they are under no obligation to receive care at these facilities.  PASRR submitted to EDS on       PASRR number received on       Existing PASRR number confirmed on 11/28/15     FL2 transmitted to all facilities in geographic area requested by pt/family on 11/28/15     FL2 transmitted to all facilities within larger geographic area on       Patient informed that his/her managed care company has contracts with or will negotiate with certain facilities, including the following:        Yes   Patient/family informed of bed offers received.  [name]     Physician recommends and patient chooses bed at      [facility] on  .  Patient to be transferred to facility by       Patient family notified on   of transfer.  Name of family member notified:        PHYSICIAN       Additional Comment:    _______________________________________________ Karn CassisStultz, Ara Grandmaison  Shanaberger, LCSW 11/29/2015, 8:51 AM [phone]

## 2015-11-29 NOTE — Clinical Social Work Placement (Addendum)
   CLINICAL SOCIAL WORK PLACEMENT  NOTE  Date:  11/29/2015  Patient Details  Name: Alyssa Jacobson MRN: [id number] Date of Birth: 09/13/1922  Clinical Social Work is seeking post-discharge placement for this patient at the Skilled  Nursing Facility level of care (*CSW will initial, date and re-position this form in  chart as items are completed):  Yes   Patient/family provided with [place] Clinical Social Work Department's list of facilities offering this level of care within the geographic area requested by the patient (or if unable, by the patient's family).  Yes   Patient/family informed of their freedom to choose among providers that offer the needed level of care, that participate in Medicare, Medicaid or managed care program needed by the patient, have an available bed and are willing to accept the patient.  Yes   Patient/family informed of [place]'s ownership interest in [facility] and [facility], as well as of the fact that they are under no obligation to receive care at these facilities.  PASRR submitted to EDS on       PASRR number received on       Existing PASRR number confirmed on 11/28/15     FL2 transmitted to all facilities in geographic area requested by pt/family on 11/28/15     FL2 transmitted to all facilities within larger geographic area on       Patient informed that his/her managed care company has contracts with or will negotiate with certain facilities, including the following:        Yes   Patient/family informed of bed offers received.  [name]     Physician recommends and patient chooses bed at      [facility] on 11/29/15.  Patient to be transferred to facility by staff     Patient family notified on 11/29/15 of transfer.  Name of family member notified:  Forbes CellarCorinda- daughter     PHYSICIAN       Additional Comment:  Auth:  [id number]  _______________________________________________ Karn CassisStultz, Jaamal Farooqui Shanaberger, LCSW 11/29/2015, 3:34 PM [phone]

## 2015-11-29 NOTE — NC FL2 (Signed)
[place] MEDICAID FL2 LEVEL OF CARE SCREENING TOOL     IDENTIFICATION  Patient Name: Alyssa Jacobson Birthdate: 06/29/1923 Sex: female Admission Date (Current Location): 11/27/2015  [facility] [place]   Facility and Address:  [facility],  [address], [place] [id number]      Provider Number: [id number]  Attending Physician Name and Address:  Erick BlinksJehanzeb Memon, MD  Relative Name and Phone Number:       Current Level of Care: Hospital Recommended Level of Care: Skilled Nursing Facility Prior Approval Number:    Date Approved/Denied:   PASRR Number: [id number]  Discharge Plan: SNF    Current Diagnoses: Patient Active Problem List   Diagnosis Date Noted  . Acute respiratory failure with hypoxia (HCC) 11/28/2015  . Generalized weakness 11/28/2015  . Dementia 11/28/2015  . Protein-calorie malnutrition, severe 11/28/2015  . CAP (community acquired pneumonia) 11/27/2015  . Fracture, intertrochanteric, right femur (HCC) 05/02/2015  . Lactic acidosis 05/02/2015  . Leucocytosis 05/02/2015  . UTI (lower urinary tract infection) 05/02/2015  . Hypertension   . High cholesterol     Orientation RESPIRATION BLADDER Height & Weight     Self  O2 (3 L) Incontinent Weight: 118 lb 2.7 oz (53.6 kg) Height:  5\' 3"  (160 cm)  BEHAVIORAL SYMPTOMS/MOOD NEUROLOGICAL BOWEL NUTRITION STATUS  Other (Comment) (n/a)  (n/a) Incontinent Diet (Dysphagia 2 with thin liquids. 100% supervision and feeder assist. Only feed pt when alert and upright. PO meds crushed as able in puree. Ensure clear oral cavity before presenting additional po. )  AMBULATORY STATUS COMMUNICATION OF NEEDS Skin   Total Care Verbally Normal                       Personal Care Assistance Level of Assistance  Bathing, Feeding, Dressing Bathing Assistance: Maximum assistance Feeding assistance: Maximum assistance Dressing Assistance: Maximum assistance     Functional  Limitations Info  Sight, Hearing, Speech Sight Info: Impaired Hearing Info: Adequate Speech Info: Adequate    SPECIAL CARE FACTORS FREQUENCY  Speech therapy     PT Frequency: 5              Contractures Contractures Info: Not present    Additional Factors Info  Psychotropic Code Status Info: DNR Allergies Info: Sulfa Antibiotics Psychotropic Info: Risperdal, Xanax         Current Medications (11/29/2015):  This is the current hospital active medication list Current Facility-Administered Medications  Medication Dose Route Frequency Provider Last Rate Last Dose  . acetaminophen (TYLENOL) tablet 650 mg  650 mg Oral Q6H PRN Clydie Braunondell A Smith, MD   650 mg at 11/28/15 2152  . albuterol (PROVENTIL) (2.5 MG/3ML) 0.083% nebulizer solution 2.5 mg  2.5 mg Nebulization Q2H PRN Clydie Braunondell A Smith, MD      . ALPRAZolam Prudy Feeler(XANAX) tablet 0.5 mg  0.5 mg Oral BID PRN Clydie Braunondell A Smith, MD   0.5 mg at 11/28/15 2152  . antiseptic oral rinse (CPC / CETYLPYRIDINIUM CHLORIDE 0.05%) solution 7 mL  7 mL Mouth Rinse BID Rondell A Katrinka BlazingSmith, MD   7 mL at 11/28/15 2200  . azithromycin (ZITHROMAX) 500 mg in dextrose 5 % 250 mL IVPB  500 mg Intravenous Q24H Clydie Braunondell A Smith, MD   500 mg at 11/28/15 1342  . benzonatate (TESSALON) capsule 200 mg  200 mg Oral BID PRN Clydie Braunondell A Smith, MD      . calcium-vitamin D (OSCAL WITH D) 500-200 MG-UNIT per tablet  1 tablet  1 tablet Oral Daily Clydie Braun, MD   1 tablet at 11/27/15 1630  . cefTRIAXone (ROCEPHIN) 1 g in dextrose 5 % 50 mL IVPB  1 g Intravenous Q24H Clydie Braun, MD   1 g at 11/28/15 1224  . dextrose 5 % solution   Intravenous Continuous Jerald Kief, MD 75 mL/hr at 11/28/15 [phone]    . donepezil (ARICEPT) tablet 5 mg  5 mg Oral Daily Clydie Braun, MD   5 mg at 11/27/15 1630  . enoxaparin (LOVENOX) injection 40 mg  40 mg Subcutaneous Q24H Jerald Kief, MD   40 mg at 11/28/15 1745  . folic acid (FOLVITE) tablet 1 mg  1 mg Oral Daily Rondell Burtis Junes, MD   1  mg at 11/27/15 1630  . guaiFENesin (MUCINEX) 12 hr tablet 600 mg  600 mg Oral BID Clydie Braun, MD   600 mg at 11/28/15 2152  . ipratropium-albuterol (DUONEB) 0.5-2.5 (3) MG/3ML nebulizer solution 3 mL  3 mL Nebulization Q6H Rondell Burtis Junes, MD   3 mL at 11/29/15 0748  . multivitamin-lutein (OCUVITE-LUTEIN) capsule 1 capsule  1 capsule Oral Daily Clydie Braun, MD   1 capsule at 11/27/15 1630  . pantoprazole (PROTONIX) injection 40 mg  40 mg Intravenous Q24H Jerald Kief, MD   40 mg at 11/28/15 1744  . risperiDONE (RISPERDAL) tablet 1 mg  1 mg Oral QHS Clydie Braun, MD   1 mg at 11/28/15 2152  . vitamin C (ASCORBIC ACID) tablet 250 mg  250 mg Oral Daily Clydie Braun, MD   250 mg at 11/27/15 1630     Discharge Medications: Please see discharge summary for a list of discharge medications.  Relevant Imaging Results:  Relevant Lab Results:   Additional Information SS#: [id number]  Karn Cassis, [place] [phone]

## 2015-11-29 NOTE — Progress Notes (Signed)
Pt IVs removed, tolerated well. [facility].  Answered all questions at this time.  [facility]

## 2015-11-29 NOTE — Care Management Note (Addendum)
Case Management Note  Patient Details  Name: Alyssa Jacobson MRN: [id number] Date of Birth: 04/28/1923  Subjective/Objective:                    Action/Plan: discharge to SNF today , CSW following [facility]   Expected Discharge Date:                  Expected Discharge Plan:  Skilled Nursing Facility  In-House Referral:  Clinical Social Work  Discharge planning Services  CM Consult  Post Acute Care Choice:   Choice offered to:     DME Arranged:    DME Agency:     HH Arranged:    HH Agency:  [facility]    Status of Service:  Completed, signed off  Medicare Important Message Given:    Date Medicare IM Given:    Medicare IM give by:    Date Additional Medicare IM Given:    Additional Medicare Important Message give by:     If discussed at Long Length of Stay Meetings, dates discussed:    Additional Comments:  Adonis HugueninBerkhead, Malerie Eakins L, RN 11/29/2015, 2:18 PM

## 2015-11-30 ENCOUNTER — Encounter: Payer: Self-pay | Admitting: Internal Medicine

## 2015-11-30 ENCOUNTER — Non-Acute Institutional Stay (SKILLED_NURSING_FACILITY): Payer: PPO | Admitting: Internal Medicine

## 2015-11-30 DIAGNOSIS — F0390 Unspecified dementia without behavioral disturbance: Secondary | ICD-10-CM | POA: Diagnosis not present

## 2015-11-30 DIAGNOSIS — R531 Weakness: Secondary | ICD-10-CM | POA: Diagnosis not present

## 2015-11-30 DIAGNOSIS — J189 Pneumonia, unspecified organism: Secondary | ICD-10-CM

## 2015-11-30 NOTE — Assessment & Plan Note (Addendum)
Complete dual antibiotic therapy and monitor for any signs of respiratory compromise

## 2015-11-30 NOTE — Assessment & Plan Note (Signed)
Clinically I doubt addition of Namenda would be of any benefit due to the advanced stage of her dementia

## 2015-11-30 NOTE — Progress Notes (Signed)
Patient ID: Alyssa Jacobson, female   DOB: 06/18/1923, 82 y.o.   MRN: [id number]    [facility]: 130  Chief Complaint  Patient presents with  . New Admit To SNF    [facility]   Allergies  Allergen Reactions  . Sulfa Antibiotics Other (See Comments)    unknown    This is a comprehensive admission note [facility] personally performed by Marga MelnickWilliam Kennis Buell MD on this date less than 30 days from date of admission. Included are preadmission medical/surgical history;reconciled medication list; family history; social history and comprehensive review of systems.  Corrections and additions to the records were documented . Comprehensive physical exam was also performed. Additionally a clinical summary was entered for each active diagnosis pertinent to this admission in the Problem List to enhance continuity of care.  PCP: Dr. [name]  HPI:The patient was hospitalized 5/29-5/31/17 for LLL community-acquired pneumonia. This was, complicated by acute respiratory failure with hypoxia. She was discharged on azithromycin and cefuroxime. She also is on nebulized treatments 3 times a day.  Past medical and surgical history: Medical diagnoses include hypertension, dyslipidemia, dementia, and protein calorie malnutrition. He has had surgery for right femur fracture in 2016.  Social history: Nonsmoker and nondrinker  Family history: No family history in the chart; patient's dementia precludes updating these data  Comprehensive review of systems was asked but she cannot provide any lucid information. Her repetitive response is "I forget". This was also her answer to request for the date and the name of the president.   Physical exam:  Pertinent or positive findings: She appears chronically ill and suboptimally nourished. She sits in a wheelchair with supplemental oxygen.  She exhibit an isolated nonproductive cough is slightly rattly. She has no significant facial  expression simply a blank stare. There is slight asymmetry of the nasolabial folds. She has an upper plate. She has an anterior lower partial. Her remaining teeth are in extremely poor state of health. There is slight variation in the cardiac rhythm. Breath sounds are decreased at the left base. She has some interosseous muscle wasting of the hands. Pedal pulses are decreased. Strength is markedly decreased throughout. DTRs are 0+ at the knees and 1+ in the upper extremities.  General appearance: no acute distress , increased work of breathing is present.   Lymphatic: No lymphadenopathy about the head, neck, axilla . Eyes: No conjunctival inflammation or lid edema is present. There is no scleral icterus. Ears:  External ear exam shows no significant lesions or deformities.   Nose:  External nasal examination shows no deformity or inflammation. Nasal mucosa are pink and moist without lesions ,exudates Oral exam: lips and gums are healthy appearing.There is no oropharyngeal erythema or exudate . Neck:  No thyromegaly, masses, tenderness noted.    Heart:  No gallop, murmur, click, rub .  Lungs: Breath sounds are generally reduced Abdomen:Bowel sounds are normal. Abdomen is soft and nontender with no organomegaly, hernias,masses. GU: deferred. Extremities:  No cyanosis, clubbing,edema  Neurologic exam : Balance,Rhomberg,finger to nose testing could not be completed due to clinical state Skin: Warm & dry w/o tenting. No significant lesions or rash.  See clinical summary under each active problem in the Problem List with associated updated therapeutic plan

## 2015-11-30 NOTE — Patient Instructions (Signed)
No new orders Continue pulmonary toilet.

## 2015-11-30 NOTE — Assessment & Plan Note (Signed)
PT/OT as tolerated 

## 2015-12-01 ENCOUNTER — Non-Acute Institutional Stay (SKILLED_NURSING_FACILITY): Payer: PPO | Admitting: Internal Medicine

## 2015-12-01 ENCOUNTER — Encounter: Payer: Self-pay | Admitting: Internal Medicine

## 2015-12-01 DIAGNOSIS — F0391 Unspecified dementia with behavioral disturbance: Secondary | ICD-10-CM | POA: Diagnosis not present

## 2015-12-01 DIAGNOSIS — M25561 Pain in right knee: Secondary | ICD-10-CM | POA: Insufficient documentation

## 2015-12-01 DIAGNOSIS — J189 Pneumonia, unspecified organism: Secondary | ICD-10-CM | POA: Diagnosis not present

## 2015-12-01 NOTE — Progress Notes (Signed)
Location:  [facility] Room Number: 130/P Place of Service:  SNF (31) Provider:  Phylis BougieArlo Henritta Mutz  Zack Hall, MD  Patient Care Team: Benita StabileJohn Z Hall, MD as PCP - General (Internal Medicine) Isaiah BlakesMichael S Forrest, LCSW as [facility] Care Management (Licensed Clinical Social Worker) Nunzio CoryAlisa G Gilboy, RN as [facility] Care Management  Extended Emergency Contact Information Primary Emergency Contact: Thompson,Corinda Address: [address]          [place], [place] [address] [place] Home Phone: [phone] Relation: Daughter Secondary Emergency Contact: Aurora Maskobertson,Thomas  [place] Home Phone: [phone] Mobile Phone: [phone] Relation: Son  Code Status:  DNR Goals of care: Advanced Directive information Advanced Directives 12/01/2015  Does patient have an advance directive? Yes  Type of Advance Directive Out of facility DNR (pink MOST or yellow form)  Does patient want to make changes to advanced directive? No - Patient declined  Copy of advanced directive(s) in chart? Yes  Pre-existing out of facility DNR order (yellow form or pink MOST form) -   Chief complaint-acute visit secondary to right knee edema-pain    HPI:  Pt is a 82 y.o. female seen today for an acute visit for right knee discomfort-patient has severe dementia so this is challenging to fully localize.  However nursing staff has noted a slightly swollen right knee she is complaining of pain although when I ask her she says she is hurting all over and does not really localize the pain.  She is here for rehabilitation after hospitalization for community-acquired pneumonia, treated by acute respiratory failure with hypoxia.  She continues on azithromycin and cefuroxime.  She is also on nebulizer treatments 3 times a day.  Per chart review it appears she does have a previous history of a right hip fracture repair-she is also had an x-ray of  her right knee in January 2017-this was taken apparently after a fall and showed medial patellar subluxation and degenerative changes.  Previous x-ray back in November 2016 showed tricompartmental degenerative changes  Currently her vital signs are stable she cannot really give any review of systems just says she isn't comfortable   Past Medical History  Diagnosis Date  . Macular degeneration of both eyes   . Hypertension   . High cholesterol   . Hip fracture (HCC)   . Ankle fracture   . Dementia    Past Surgical History  Procedure Laterality Date  . Femur im nail Right 05/03/2015    Procedure: INTRAMEDULLARY (IM) NAIL FEMORAL;  Surgeon: Sheral Apleyimothy D Murphy, MD;  Location: [facility] OR;  Service: Orthopedics;  Laterality: Right;    Allergies  Allergen Reactions  . Sulfa Antibiotics Other (See Comments)    unknown    Current Outpatient Prescriptions on File Prior to Visit  Medication Sig Dispense Refill  . acetaminophen (TYLENOL) 325 MG tablet Take 2 tablets (650 mg total) by mouth every 6 (six) hours as needed for mild pain (or Fever >/= 101).    Marland Kitchen. ALPRAZolam (XANAX) 0.5 MG tablet Take 1 tablet (0.5 mg total) by mouth 2 (two) times daily as needed for anxiety. 30 tablet 0  . azithromycin (ZITHROMAX) 500 MG tablet Take 1 tablet (500 mg total) by mouth daily. 5 tablet 0  . benzonatate (TESSALON) 100 MG capsule Take 200 mg by mouth 2 (two) times daily as needed for cough.    . Calcium Carb-Cholecalciferol (CALCIUM 600 + D PO) Take 1 tablet by mouth 2 (  two) times daily.    . cefUROXime (CEFTIN) 500 MG tablet Take 1 tablet (500 mg total) by mouth 2 (two) times daily with a meal. 10 tablet 0  . donepezil (ARICEPT) 5 MG tablet Take 1 tablet by mouth daily.    . folic acid (FOLVITE) 800 MCG tablet Take 800 mcg by mouth daily.    Marland Kitchen guaiFENesin (MUCINEX) 600 MG 12 hr tablet Take 1 tablet (600 mg total) by mouth 2 (two) times daily.    Marland Kitchen ipratropium-albuterol (DUONEB) 0.5-2.5 (3) MG/3ML SOLN Take 3  mLs by nebulization 3 (three) times daily. 360 mL   . Multiple Vitamins-Minerals (PRESERVISION AREDS PO) Take 1 tablet by mouth daily.    Marland Kitchen omeprazole (PRILOSEC) 20 MG capsule Take 20 mg by mouth daily.    . risperiDONE (RISPERDAL) 1 MG tablet Take 1 mg by mouth at bedtime.     No current facility-administered medications on file prior to visit.     Review of Systems   PC history of present illness again limited secondary to dementia nursing staff does not report any complaints of shortness of breath or chest pain patient has severe dementia and is verbalizing quite frequently but appears to be somewhat unfocused  Immunization History  Administered Date(s) Administered  . Pneumococcal-Unspecified 04/01/2015   Pertinent  Health Maintenance Due  Topic Date Due  . DEXA SCAN  11/30/2016 (Originally 06[birth year]04/1990)  . INFLUENZA VACCINE  01/30/2016  . PNA vac Low Risk Adult (2 of 2 - PCV13) 03/31/2016   Fall Risk  11/08/2015 10/30/2015 10/26/2015  Falls in the past year? Yes Yes Yes  Number falls in past yr: - 2 or more 2 or more  Injury with Fall? No No No  Risk Factor Category  High Fall Risk High Fall Risk High Fall Risk  Risk for fall due to : History of fall(s);Impaired balance/gait;Impaired mobility;Mental status change History of fall(s);Impaired balance/gait;Impaired vision;Mental status change History of fall(s);Impaired balance/gait;Impaired vision;Mental status change  Follow up Education provided;Falls prevention discussed Falls prevention discussed Falls prevention discussed   Functional Status Survey:    Temperature 97.8 pulse 80 respirations 22 blood pressure 131/64 O2 saturation 96% on room air  Physical Exam  In general this is a frail elderly female lying in bed does not appear to be in any acute distress but is anxious.  Her skin is warm and dry.  Oropharynx appears clear mucous membranes moist.  Chest could not appreciate any overt congestion although respiratory  effort was very poor there is no labored breathing.  Heart somewhat distant heart sounds since patient is verbalizing difficult to auscultate however from what I could hear and what I ascertain from the radial pulse is largely regular with occasional irregular beats.  GU cannot appreciate any overt abnormality.  Musculoskeletal has diffuse wasting of her hands general frailty does move all extremities 4-there is some mild discomfort when her legs are flexed at the knee this is bilaterally however and not confined to the right knee She does have arthritic changes to both knees does appear to have some mild edema of the right knee compared to left slightly warm slight erythema.  Neurologic she is able to move all cervix 4 cranial nerves appear grossly intact again this was limited secondary to patient's severe dementia and really not following verbal commands.  Psych again findings consistent with severe dementia  Labs reviewed:  Recent Labs  05/05/15 0420 11/27/15 1200 11/29/15 0430  NA 135 135 133*  K 4.9 4.2  3.5  CL 99* 99* 97*  CO2 29 29 30   GLUCOSE 107* 116* 131*  BUN 15 19 20   CREATININE 0.90 0.87 0.63  CALCIUM 8.1* 8.9 8.8*    Recent Labs  05/02/15 2038 05/03/15 0044 11/27/15 1259  AST 25 14* 18  ALT 14 28 10*  ALKPHOS 48 44 47  BILITOT 0.6 0.4 0.7  PROT 6.5 6.1* 6.7  ALBUMIN 3.7 3.2* 3.0*    Recent Labs  05/02/15 2038 05/03/15 0044  05/05/15 0420 11/27/15 1200 11/29/15 0430  WBC 15.2* 16.5*  < > 11.8* 10.9* 8.7  NEUTROABS 12.7* 14.1*  --   --  8.1*  --   HGB 12.1 11.4*  < > 8.9* 12.0 9.8*  HCT 37.6 34.8*  < > 26.6* 38.3 30.4*  MCV 96.7 96.1  < > 95.0 98.7 98.7  PLT 221 205  < > 151 245 225  < > = values in this interval not displayed. No results found for: TSH No results found for: HGBA1C No results found for: CHOL, HDL, LDLCALC, LDLDIRECT, TRIG, CHOLHDL  Significant Diagnostic Results in last 30 days:  Dg Chest Portable 1 View  11/27/2015   CLINICAL DATA:  Shortness of breath and cough EXAM: PORTABLE CHEST 1 VIEW COMPARISON:  05/02/2015 FINDINGS: Cardiac shadow is stable. Elevation of left hemidiaphragm is again seen. Left basilar infiltrate is noted. The right lung is clear. No bony abnormality is seen. IMPRESSION: Left basilar infiltrate. Electronically Signed   By: Alcide Clever M.D.   On: 11/27/2015 12:26   Dg Foot Complete Right  11/27/2015  CLINICAL DATA:  Right foot pain, no known injury, initial encounter EXAM: RIGHT FOOT COMPLETE - 3+ VIEW COMPARISON:  None. FINDINGS: Generalized osteopenia is noted. No bony erosion is identified. No acute fracture dislocation is seen. IMPRESSION: No acute abnormality noted. Electronically Signed   By: Alcide Clever M.D.   On: 11/27/2015 12:27    Assessment/Plan 1 history of suspected right knee discomfort-again she does have an arthritic history here there's been no reports of trauma to my knowledge will order a re-x-ray of the knee and await results-also will order a venous Doppler secondary to edema although I think the likelihood of a clot is fairly low would like to make sure this is not an etiology however.  In regards to pain management she does have when necessary Tylenol-will order Ultram when necessary if felt Tylenol is not effective-will start low-dose 25 mg every 6 hours when necessary we may have to titrate this up monitor for sedation.  Also monitor vital signs pulse ox every shift for 72 hours to keep a close eye on this as well.  I note that anxiety may be playing a role in this as well she is on Xanax 0.5 mg twice a day when necessary at this point will monitor before increasing dose  #2 pneumonia-she is finishing treatment with azithromycin and Ceftin-also has nebulizers and Mucinex at this point will monitor  [id number]      London Sheer, [place] [phone]

## 2015-12-04 ENCOUNTER — Other Ambulatory Visit: Payer: Self-pay | Admitting: *Deleted

## 2015-12-04 MED ORDER — TRAMADOL HCL 50 MG PO TABS: ORAL_TABLET | ORAL | Status: DC

## 2015-12-04 NOTE — Telephone Encounter (Signed)
[facility]  

## 2015-12-05 ENCOUNTER — Other Ambulatory Visit: Payer: Self-pay | Admitting: Licensed Clinical Social Worker

## 2015-12-05 ENCOUNTER — Encounter: Payer: Self-pay | Admitting: Internal Medicine

## 2015-12-05 ENCOUNTER — Non-Acute Institutional Stay (SKILLED_NURSING_FACILITY): Payer: PPO | Admitting: Internal Medicine

## 2015-12-05 DIAGNOSIS — J189 Pneumonia, unspecified organism: Secondary | ICD-10-CM

## 2015-12-05 DIAGNOSIS — R52 Pain, unspecified: Secondary | ICD-10-CM | POA: Diagnosis not present

## 2015-12-05 NOTE — Progress Notes (Signed)
Location:  [facility] Room Number: 130/P Place of Service:  SNF (31) Provider:  Phylis Bougie, MD  Patient Care Team: Benita Stabile, MD as PCP - General (Internal Medicine) Isaiah Blakes, LCSW as [facility] Care Management (Licensed Clinical Social Worker) Nunzio Cory, RN as [facility] Care Management  Extended Emergency Contact Information Primary Emergency Contact: Thompson,Corinda Address: [address]          [place], [place] [address] [place] Home Phone: [phone] Relation: Daughter Secondary Emergency Contact: Aurora [place] Home Phone: [phone] Mobile Phone: [phone] Relation: Son  Code Status:  DNR Goals of care: Advanced Directive information Advanced Directives 12/05/2015  Does patient have an advance directive? Yes  Type of Estate agent of Time;Out of facility DNR (pink MOST or yellow form)  Does patient want to make changes to advanced directive? No - Patient declined  Copy of advanced directive(s) in chart? Yes     Acute visit follow-up pain management-pneumonia-  HPI:  Pt is a 82 y.o. female seen today for an acute visit for follow-up of pain management-patient does have generalized weakness and arthritic changes and is status post right hip repair-x-rays recently done did not really show any acute process she is receiving tramadol every 6 hours when necessary apparently this is giving some relief-but nursing feels she would benefit from more frequent dosing-there's been no sedation noted by nursing staff.  She also has a history of pneumonia left basilar infiltrate that presented with sepsis in the hospital she was treated with IV antibiotics and transition to oral Zithromax a discharge which she has completed.  She continues on Mucinex in nebulizers duo nebs 3 times a day.  She is benefiting from this but nursing  staff would like a when necessary nebulizer added if needed.  During   therapy today patient at one point was found to have a pulse ox in the 50s however her finger apparently was shaking and cold and when it was tried on another finger her O2 sat was in the 90s.  She was taken back to the nursing unit and pulse ox on both fingers was in the 90s.  She did not exhibit any increased cough or shortness of breath she is a poor historian secondary to dementia but actually appear to be somewhat improved from when I saw her late last week  Vital signs appear to be stable   Past Medical History  Diagnosis Date  . Macular degeneration of both eyes   . Hypertension   . High cholesterol   . Hip fracture (HCC)   . Ankle fracture   . Dementia    Past Surgical History  Procedure Laterality Date  . Femur im nail Right 05/03/2015    Procedure: INTRAMEDULLARY (IM) NAIL FEMORAL;  Surgeon: Sheral Apley, MD;  Location: [facility] OR;  Service: Orthopedics;  Laterality: Right;    Allergies  Allergen Reactions  . Sulfa Antibiotics Other (See Comments)    unknown    Current Outpatient Prescriptions on File Prior to Visit  Medication Sig Dispense Refill  . acetaminophen (TYLENOL) 325 MG tablet Take 2 tablets (650 mg total) by mouth every 6 (six) hours as needed for mild pain (or Fever >/= 101).    Marland Kitchen ALPRAZolam (XANAX) 0.5 MG tablet Take 1 tablet (0.5 mg total) by mouth 2 (two) times daily as needed for anxiety. 30 tablet 0  .  ascorbic acid (VITAMIN C) 500 MG tablet Take 500 mg by mouth daily.    Lucilla Lame [place]-Castor Oil (VENELEX) OINT Apply to sacrum and buttocks for prevention every shift    . benzonatate (TESSALON) 100 MG capsule Take 200 mg by mouth 2 (two) times daily as needed for cough.    . Calcium Carb-Cholecalciferol (CALCIUM 600 + D PO) Take 1 tablet by mouth 2 (two) times daily.    Marland Kitchen donepezil (ARICEPT) 5 MG tablet Take 1 tablet by mouth daily.    . folic acid (FOLVITE) 800 MCG tablet Take 800  mcg by mouth daily.    Marland Kitchen guaiFENesin (MUCINEX) 600 MG 12 hr tablet Take 1 tablet (600 mg total) by mouth 2 (two) times daily.    Marland Kitchen ipratropium-albuterol (DUONEB) 0.5-2.5 (3) MG/3ML SOLN Take 3 mLs by nebulization 3 (three) times daily. 360 mL   . Multiple Vitamins-Minerals (PRESERVISION AREDS PO) Take 1 tablet by mouth daily.    Marland Kitchen omeprazole (PRILOSEC) 20 MG capsule Take 20 mg by mouth daily.    . risperiDONE (RISPERDAL) 1 MG tablet Take 1 mg by mouth at bedtime.    . traMADol (ULTRAM) 50 MG tablet Take 1/2 tablet by mouth every 6 hours as needed for pain not relieved with Tylenol. Monitor for sedating 60 tablet 0   No current facility-administered medications on file prior to visit.     Review of Systems   This is quite limited secondary to severe dementia-however when asked her this afternoon she was hurting she said no-she is not currently complaining of shortness of breath or discomfort she actually appears a bit more comfortable than she did late last week she does have anxiety and is on Xanax when necessary twice a day  Immunization History  Administered Date(s) Administered  . Pneumococcal-Unspecified 04/01/2015   Pertinent  Health Maintenance Due  Topic Date Due  . DEXA SCAN  11/30/2016 (Originally 06/04/1990)  . INFLUENZA VACCINE  01/30/2016  . PNA vac Low Risk Adult (2 of 2 - PCV13) 03/31/2016   Fall Risk  12/05/2015 11/08/2015 10/30/2015 10/26/2015  Falls in the past year? Yes Yes Yes Yes  Number falls in past yr: 2 or more - 2 or more 2 or more  Injury with Fall? No No No No  Risk Factor Category  High Fall Risk High Fall Risk High Fall Risk High Fall Risk  Risk for fall due to : History of fall(s);Impaired balance/gait;Impaired mobility;Mental status change History of fall(s);Impaired balance/gait;Impaired mobility;Mental status change History of fall(s);Impaired balance/gait;Impaired vision;Mental status change History of fall(s);Impaired balance/gait;Impaired vision;Mental  status change  Follow up Education provided;Falls prevention discussed Education provided;Falls prevention discussed Falls prevention discussed Falls prevention discussed   Functional Status Survey:    Filed Vitals:   12/05/15 1401  BP: 117/61  Pulse: 88  Temp: 97.3 F (36.3 C)  TempSrc: Oral  Resp: 18  Height: 5\' 3"  (1.6 m)  Weight: 121 lb 12.8 oz (55.248 kg)  SpO2: 97%   Body mass index is 21.58 kg/(m^2). Physical Exam   In general this is a frail elderly female currently sitting in her wheelchair. Appears somewhat anxious but not in distress.  Her skin is warm and dry.  Oropharynx is clear mucous membranes moist.  Eyes she has prescription lenses sclera and conjunctiva appear clear visual acuity appears grossly intact.  Chest is clear to auscultation with shallow air entry I do not see labored breathing somewhat poor respiratory effort but she will take a deep breath  when she strongly encouraged.  Heart is regular rate and rhythm without murmur gallop or rub somewhat distant heart sounds she has trace lower extremity edema.  Muscle skeletal has diffuse wasting of her hands in general frailty has some mild discomfort when her legs continued be flexed at the knee.  Continued with arthritic changes to both knees with some generalized stiffness.  Neurologic is able to move all extremities 4 cranial nerves are grossly intact she is speaking somewhat more today than when I saw her last week.  Psych she does appear to have significant dementia but is actually following some verbal commands today which is more than what occurred last week  Labs reviewed:  Recent Labs  05/05/15 0420 11/27/15 1200 11/29/15 0430  NA 135 135 133*  K 4.9 4.2 3.5  CL 99* 99* 97*  CO2 29 29 30   GLUCOSE 107* 116* 131*  BUN 15 19 20   CREATININE 0.90 0.87 0.63  CALCIUM 8.1* 8.9 8.8*    Recent Labs  05/02/15 2038 05/03/15 0044 11/27/15 1259  AST 25 14* 18  ALT 14 28 10*  ALKPHOS 48  44 47  BILITOT 0.6 0.4 0.7  PROT 6.5 6.1* 6.7  ALBUMIN 3.7 3.2* 3.0*    Recent Labs  05/02/15 2038 05/03/15 0044  05/05/15 0420 11/27/15 1200 11/29/15 0430  WBC 15.2* 16.5*  < > 11.8* 10.9* 8.7  NEUTROABS 12.7* 14.1*  --   --  8.1*  --   HGB 12.1 11.4*  < > 8.9* 12.0 9.8*  HCT 37.6 34.8*  < > 26.6* 38.3 30.4*  MCV 96.7 96.1  < > 95.0 98.7 98.7  PLT 221 205  < > 151 245 225  < > = values in this interval not displayed. No results found for: TSH No results found for: HGBA1C No results found for: CHOL, HDL, LDLCALC, LDLDIRECT, TRIG, CHOLHDL  Significant Diagnostic Results in last 30 days:  Dg Chest Portable 1 View  11/27/2015  CLINICAL DATA:  Shortness of breath and cough EXAM: PORTABLE CHEST 1 VIEW COMPARISON:  05/02/2015 FINDINGS: Cardiac shadow is stable. Elevation of left hemidiaphragm is again seen. Left basilar infiltrate is noted. The right lung is clear. No bony abnormality is seen. IMPRESSION: Left basilar infiltrate. Electronically Signed   By: Alcide CleverMark  Lukens M.D.   On: 11/27/2015 12:26   Dg Foot Complete Right  11/27/2015  CLINICAL DATA:  Right foot pain, no known injury, initial encounter EXAM: RIGHT FOOT COMPLETE - 3+ VIEW COMPARISON:  None. FINDINGS: Generalized osteopenia is noted. No bony erosion is identified. No acute fracture dislocation is seen. IMPRESSION: No acute abnormality noted. Electronically Signed   By: Alcide CleverMark  Lukens M.D.   On: 11/27/2015 12:27    Assessment/Plan #1 pain management with history of osteoarthritis-Will increased frequency of tramadol every 4 hours when necessary and monitor apparently she is getting relief with the tramadol.  #2 history of pneumonia-she has completed antibiotics-O2 saturations appear to be satisfactory todaywhen we are able to get accurate readings-apparently at times she does have some intermittent coughing congestion she continues on Mucinex will give her  when necessary nebulizer if needed she also  on duo nebs 3 times a day  routinely-also will update a chest x-ray 2 view.  #3 anemia hemoglobin was 9.8 on May 31 appears she's had variable hemoglobins in the past ranging from 12 down to the high eights-will update this I don't see any evidence of bleeding here but will see what the updated lab tells  [place].   [id number]        London Sheer, CMA [id number]

## 2015-12-05 NOTE — Patient Outreach (Signed)
[facility]) Care Management  [facility] Social Work  12/05/2015  ENDYA AUSTIN 1923-04-04 [id number]  Subjective:    Objective:   Encounter Medications:  Outpatient Encounter Prescriptions as of 12/05/2015  Medication Sig  . acetaminophen (TYLENOL) 325 MG tablet Take 2 tablets (650 mg total) by mouth every 6 (six) hours as needed for mild pain (or Fever >/= 101).  Marland Kitchen ALPRAZolam (XANAX) 0.5 MG tablet Take 1 tablet (0.5 mg total) by mouth 2 (two) times daily as needed for anxiety.  Marland Kitchen ascorbic acid (VITAMIN C) 500 MG tablet Take 500 mg by mouth daily.  Marland Kitchen azithromycin (ZITHROMAX) 500 MG tablet Take 1 tablet (500 mg total) by mouth daily.  Roseanne Kaufman Peru-Castor Oil (VENELEX) OINT Apply to sacrum and buttocks for prevention every shift  . benzonatate (TESSALON) 100 MG capsule Take 200 mg by mouth 2 (two) times daily as needed for cough.  . Calcium Carb-Cholecalciferol (CALCIUM 600 + D PO) Take 1 tablet by mouth 2 (two) times daily.  . cefUROXime (CEFTIN) 500 MG tablet Take 1 tablet (500 mg total) by mouth 2 (two) times daily with a meal.  . donepezil (ARICEPT) 5 MG tablet Take 1 tablet by mouth daily.  . folic acid (FOLVITE) 287 MCG tablet Take 800 mcg by mouth daily.  Marland Kitchen guaiFENesin (MUCINEX) 600 MG 12 hr tablet Take 1 tablet (600 mg total) by mouth 2 (two) times daily.  Marland Kitchen ipratropium-albuterol (DUONEB) 0.5-2.5 (3) MG/3ML SOLN Take 3 mLs by nebulization 3 (three) times daily.  . Multiple Vitamins-Minerals (PRESERVISION AREDS PO) Take 1 tablet by mouth daily.  Marland Kitchen omeprazole (PRILOSEC) 20 MG capsule Take 20 mg by mouth daily.  . risperiDONE (RISPERDAL) 1 MG tablet Take 1 mg by mouth at bedtime.  . traMADol (ULTRAM) 50 MG tablet Take 1/2 tablet by mouth every 6 hours as needed for pain not relieved with Tylenol. Monitor for sedating   No facility-administered encounter medications on file as of 12/05/2015.    Functional Status:  In your present state of health, do you have any  difficulty performing the following activities: 11/27/2015 11/08/2015  Hearing? N N  Vision? N Y  Difficulty concentrating or making decisions? Y -  Walking or climbing stairs? Y -  Dressing or bathing? Y -  Doing errands, shopping? N -  Preparing Food and eating ? - -  Using the Toilet? - -  In the past six months, have you accidently leaked urine? - -  Do you have problems with loss of bowel control? - -  Managing your Medications? - -  Managing your Finances? - -  Housekeeping or managing your Housekeeping? - -    Fall/Depression Screening:  PHQ 2/9 Scores 11/08/2015 10/30/2015 10/26/2015  PHQ - 2 Score - 2 -  PHQ- 9 Score - 7 -  Exception Documentation Medical reason - Medical reason    Assessment:   CSW traveled to [facility] in [place], [place] on 12/05/15.  CSW met with client on 12/05/15 at client's room at [facility].   Patient assessed in  [place] for continued care needs. CSW will continue to collaborate with the skilled nursing facility social worker to facilitate discharge planning needs and communicate with the patient and family.  Client is receiving nursing care at facility. Client is also receiving physical therapy sessions for client at facility.  Client said that physical therapy sessions for client at facility were helping client. She said she is receiving support from her  daughter, Sandria Senter. Client is using oxygen as prescribed to help her breathe. Client is using wheelchair at facility to help her ambulate. Client and CSW spoke of client care plan. CSW encouraged client to participate in all scheduled client physical therapy sessions in next 30 days at facility. Client said she is enjoying physical therapy sessions and plans to participate in scheduled physical therapy sessions for client.  Client said she hopes to be able to return home when able to discharge from facility. She said she had been previously receiving some in home support at her  home prior to coming to [facility].  CSW gave client [facility] CSW card and encouraged client or Sandria Senter (daughter of client) to call CSW as needed at [phone] to discuss social work needs of client. CSW also encouraged client and Corinda to talk with Bard Herbert, facility social worker, to finalize discharge plans of client. CSW thanked client for allowing CSW to visit her at [facility] on 12/05/15.   Plan:   Client to participate in all scheduled physical therapy sessions for client in next 30 days at facility. CSW to call client/Corinda (daughter of client) in two weeks to assess status/needs of client at that time.  Norva Riffle.Keiyana Stehr MSW, LCSW Licensed Clinical Social Worker [facility] Care Management [phone]

## 2015-12-06 ENCOUNTER — Non-Acute Institutional Stay (SKILLED_NURSING_FACILITY): Payer: PPO | Admitting: Internal Medicine

## 2015-12-06 ENCOUNTER — Other Ambulatory Visit: Payer: Self-pay | Admitting: *Deleted

## 2015-12-06 ENCOUNTER — Encounter: Payer: Self-pay | Admitting: Internal Medicine

## 2015-12-06 ENCOUNTER — Encounter (HOSPITAL_COMMUNITY)
Admission: RE | Admit: 2015-12-06 | Discharge: 2015-12-06 | Disposition: A | Discharge status: Home / Self Care | Payer: PPO | Source: Skilled Nursing Facility | Attending: Internal Medicine | Admitting: Internal Medicine

## 2015-12-06 DIAGNOSIS — R278 Other lack of coordination: Secondary | ICD-10-CM | POA: Diagnosis present

## 2015-12-06 DIAGNOSIS — D649 Anemia, unspecified: Secondary | ICD-10-CM

## 2015-12-06 DIAGNOSIS — R41841 Cognitive communication deficit: Secondary | ICD-10-CM | POA: Diagnosis present

## 2015-12-06 DIAGNOSIS — M6281 Muscle weakness (generalized): Secondary | ICD-10-CM | POA: Diagnosis present

## 2015-12-06 DIAGNOSIS — J189 Pneumonia, unspecified organism: Secondary | ICD-10-CM

## 2015-12-06 DIAGNOSIS — N390 Urinary tract infection, site not specified: Secondary | ICD-10-CM | POA: Diagnosis present

## 2015-12-06 DIAGNOSIS — R262 Difficulty in walking, not elsewhere classified: Secondary | ICD-10-CM | POA: Diagnosis present

## 2015-12-06 DIAGNOSIS — I10 Essential (primary) hypertension: Secondary | ICD-10-CM | POA: Diagnosis present

## 2015-12-06 DIAGNOSIS — M179 Osteoarthritis of knee, unspecified: Secondary | ICD-10-CM

## 2015-12-06 DIAGNOSIS — IMO0002 Reserved for concepts with insufficient information to code with codable children: Secondary | ICD-10-CM | POA: Insufficient documentation

## 2015-12-06 DIAGNOSIS — M25561 Pain in right knee: Secondary | ICD-10-CM | POA: Diagnosis present

## 2015-12-06 DIAGNOSIS — M1710 Unilateral primary osteoarthritis, unspecified knee: Secondary | ICD-10-CM | POA: Insufficient documentation

## 2015-12-06 LAB — CBC WITH DIFFERENTIAL/PLATELET
BASOS ABS: 0.1 10*3/uL (ref 0.0–0.1)
Basophils Relative: 1 %
EOS PCT: 5 %
Eosinophils Absolute: 0.3 10*3/uL (ref 0.0–0.7)
HCT: 30.7 % — ABNORMAL LOW (ref 36.0–46.0)
Hemoglobin: 10.1 g/dL — ABNORMAL LOW (ref 12.0–15.0)
LYMPHS PCT: 23 %
Lymphs Abs: 1.6 10*3/uL (ref 0.7–4.0)
MCH: 31.9 pg (ref 26.0–34.0)
MCHC: 32.9 g/dL (ref 30.0–36.0)
MCV: 96.8 fL (ref 78.0–100.0)
MONO ABS: 1 10*3/uL (ref 0.1–1.0)
MONOS PCT: 15 %
Neutro Abs: 3.8 10*3/uL (ref 1.7–7.7)
Neutrophils Relative %: 56 %
PLATELETS: 289 10*3/uL (ref 150–400)
RBC: 3.17 MIL/uL — ABNORMAL LOW (ref 3.87–5.11)
RDW: 12.5 % (ref 11.5–15.5)
WBC: 6.7 10*3/uL (ref 4.0–10.5)

## 2015-12-06 LAB — BASIC METABOLIC PANEL
ANION GAP: 6 (ref 5–15)
BUN: 17 mg/dL (ref 6–20)
CALCIUM: 8.9 mg/dL (ref 8.9–10.3)
CO2: 31 mmol/L (ref 22–32)
CREATININE: 0.56 mg/dL (ref 0.44–1.00)
Chloride: 99 mmol/L — ABNORMAL LOW (ref 101–111)
GFR calc Af Amer: >60 mL/min (ref >60)
GLUCOSE: 91 mg/dL (ref 65–99)
Potassium: 4 mmol/L (ref 3.5–5.1)
Sodium: 136 mmol/L (ref 135–145)

## 2015-12-06 MED ORDER — TRAMADOL HCL 50 MG PO TABS: ORAL_TABLET | ORAL | Status: AC

## 2015-12-06 NOTE — Progress Notes (Signed)
Location:  [facility] Room Number: 130/P Place of Service:  SNF (31) Provider:  Phylis BougieArlo Lassen  Zack Hall, MD  Patient Care Team: Benita StabileJohn Z Hall, MD as PCP - General (Internal Medicine) Isaiah BlakesMichael S Forrest, LCSW as [facility] Care Management (Licensed Clinical Social Worker) Nunzio CoryAlisa G Gilboy, RN as [facility] Care Management  Extended Emergency Contact Information Primary Emergency Contact: Thompson,Corinda Address: [address]          [place], [place] [address] [place] Home Phone: [phone] Relation: Daughter Secondary Emergency Contact: Aurora Maskobertson,Thomas  [place] Home Phone: [phone] Mobile Phone: [phone] Relation: Son  Code Status:  DNR Goals of care: Advanced Directive information Advanced Directives 12/06/2015  Does patient have an advance directive? Yes  Type of Estate agentAdvance Directive Healthcare Power of [place];Out of facility DNR (pink MOST or yellow form)  Does patient want to make changes to advanced directive? No - Patient declined  Copy of advanced directive(s) in chart? Yes     Acute visit follow-up ammonia and anemia  HPI:  Pt is a 82 y.o. female seen today for follow-up of pneumonia-patient does have generalized weakness and arthritic changes and is status post right hip repair-x-rays recently done did not really show any acute process she was receiving tramadol 50 mg every 6 hours palate this did not last long enough according to therapy and nursing staff-this has been increased to every 4 hours and apparently is having a beneficial effect-SRM therapy today she appeared to be doing better her with the therapist tells me she appeared somewhat stronger more alert is talking more which is encouraging.  She does have a history of pneumonia left low that was treated aggressively in the hospital she was discharged with Ceftin and Zithromax for a 5 day course and just completed that-she  does have apparently some cough at times-there was concern yesterday they thought her O2 sat was in the 50s in therapy but this appears to be more of a machine issue with the pulse ox beer-since her other failure had O2 sat in the 90s and when she got up to her room both fingers were standing in the 90s-nursing staff does not really report any increased cough today or congestion she does have nebulizers routine as well as as needed.  We have done a follow-up chest x-ray which shows left base atelectasis versus pneumonia-per review of chest x-ray done in the hospital this appears to be stable to improved hospital x-ray showed a basilar infiltrate in the left.  The x-rays were done at 2 different sites so it is somewhat difficult to make a true comparison-clinically she appears to be stable and pneumonia does appear on x-rays for a number of weeks after being successfully treated.  There are actually orders to recheck an x-ray in approximately 3 weeks  So has history of anemia with variable hemoglobins hemoglobin at 10.1 done today shows stability most recent hemoglobin previous was 9.8-suspect she does have an element of chronic disease there's been no overt bleeding noted by nursing staff       Past Medical History  Diagnosis Date  . Macular degeneration of both eyes   . Hypertension   . High cholesterol   . Hip fracture (HCC)   . Ankle fracture   . Dementia    Past Surgical History  Procedure Laterality Date  . Femur im nail Right 05/03/2015    Procedure: INTRAMEDULLARY (IM) NAIL FEMORAL;  Surgeon:  Sheral Apley, MD;  Location: [facility] OR;  Service: Orthopedics;  Laterality: Right;    Allergies  Allergen Reactions  . Sulfa Antibiotics Other (See Comments)    unknown    Current Outpatient Prescriptions on File Prior to Visit  Medication Sig Dispense Refill  . acetaminophen (TYLENOL) 325 MG tablet Take 2 tablets (650 mg total) by mouth every 6 (six) hours as needed for mild pain (or  Fever >/= 101).    Marland Kitchen ALPRAZolam (XANAX) 0.5 MG tablet Take 1 tablet (0.5 mg total) by mouth 2 (two) times daily as needed for anxiety. 30 tablet 0  . ascorbic acid (VITAMIN C) 500 MG tablet Take 500 mg by mouth daily.    Lucilla Lame Peru-Castor Oil (VENELEX) OINT Apply to sacrum and buttocks for prevention every shift    . benzonatate (TESSALON) 100 MG capsule Take 200 mg by mouth 2 (two) times daily as needed for cough.    . Calcium Carb-Cholecalciferol (CALCIUM 600 + D PO) Take 1 tablet by mouth 2 (two) times daily.    Marland Kitchen donepezil (ARICEPT) 5 MG tablet Take 1 tablet by mouth daily.    Marland Kitchen guaiFENesin (MUCINEX) 600 MG 12 hr tablet Take 1 tablet (600 mg total) by mouth 2 (two) times daily.    Marland Kitchen ipratropium-albuterol (DUONEB) 0.5-2.5 (3) MG/3ML SOLN Take 3 mLs by nebulization 3 (three) times daily. 360 mL   . Multiple Vitamins-Minerals (PRESERVISION AREDS PO) Take 1 tablet by mouth daily.    . NON FORMULARY Mighty shake 4 0z. Twice a day    . omeprazole (PRILOSEC) 20 MG capsule Take 20 mg by mouth daily.    . risperiDONE (RISPERDAL) 1 MG tablet Take 1 mg by mouth at bedtime.    . traMADol (ULTRAM) 50 MG tablet Take 1/2 tablet by mouth every 4 hours as needed for pain 90 tablet 0   No current facility-administered medications on file prior to visit.     Review of Systems   This is quite limited secondary to severe dementia However per evaluation today she appears to be more talkative feeling better more comfortable therapy concurs with this as well as nursing.  She does not complain of shortness of breath says her breathing is doing okay still has some residual cough but this apparently is slowly improving  Immunization History  Administered Date(s) Administered  . Pneumococcal-Unspecified 04/01/2015   Pertinent  Health Maintenance Due  Topic Date Due  . DEXA SCAN  11/30/2016 (Originally 06/04/1990)  . INFLUENZA VACCINE  01/30/2016  . PNA vac Low Risk Adult (2 of 2 - PCV13) 03/31/2016    Fall Risk  12/05/2015 11/08/2015 10/30/2015 10/26/2015  Falls in the past year? Yes Yes Yes Yes  Number falls in past yr: 2 or more - 2 or more 2 or more  Injury with Fall? No No No No  Risk Factor Category  High Fall Risk High Fall Risk High Fall Risk High Fall Risk  Risk for fall due to : History of fall(s);Impaired balance/gait;Impaired mobility;Mental status change History of fall(s);Impaired balance/gait;Impaired mobility;Mental status change History of fall(s);Impaired balance/gait;Impaired vision;Mental status change History of fall(s);Impaired balance/gait;Impaired vision;Mental status change  Follow up Education provided;Falls prevention discussed Education provided;Falls prevention discussed Falls prevention discussed Falls prevention discussed   Functional Status Survey:    Filed Vitals:   12/06/15 1420  BP: 126/70  Pulse: 64  Temp: 97.8 F (36.6 C)  TempSrc: Oral  Resp: 18  Height:  (1.6 m)  Weight: 121  lb 12.8 oz (55.248 kg)   Body mass index is 21.58 kg/(m^2). Physical Exam   In general this is a frail elderly female In no distress she is actually talking some today appears to be more comfortable alert  Her skin is warm and dry.  Oropharynx is clear mucous membranes moist.  Eyes she has prescription lenses sclera and conjunctiva appear clear visual acuity appears grossly intact.  Chest is clear to auscultation  I do not see labored breathing somewhat poor respiratory effort but she will take a deep breath when  strongly encouraged.  Heart is regular rate and rhythm without murmur gallop or rub somewhat distant heart sounds she has trace lower extremity edema.  Muscle skeletal has diffuse wasting of her hands in general frailty has some mild discomfort when her legs continued be flexed at the knee.  Continued with arthritic changes to both knees with some generalized stiffness. But appears to be in less pain today than I have seen previously  Neurologic is  able to move all extremities 4 cranial nerves are grossly intact she is speaking somewhat more today than when I saw her last week.  Psych she does appear to have significant dementia but is actually following some verbal commands today which is more than what occurred last week  Labs reviewed:  Recent Labs  11/27/15 1200 11/29/15 0430 12/06/15 0630  NA 135 133* 136  K 4.2 3.5 4.0  CL 99* 97* 99*  CO2 29 30 31   GLUCOSE 116* 131* 91  BUN 19 20 17   CREATININE 0.87 0.63 0.56  CALCIUM 8.9 8.8* 8.9    Recent Labs  05/02/15 2038 05/03/15 0044 11/27/15 1259  AST 25 14* 18  ALT 14 28 10*  ALKPHOS 48 44 47  BILITOT 0.6 0.4 0.7  PROT 6.5 6.1* 6.7  ALBUMIN 3.7 3.2* 3.0*    Recent Labs  05/03/15 0044  11/27/15 1200 11/29/15 0430 12/06/15 0630  WBC 16.5*  < > 10.9* 8.7 6.7  NEUTROABS 14.1*  --  8.1*  --  3.8  HGB 11.4*  < > 12.0 9.8* 10.1*  HCT 34.8*  < > 38.3 30.4* 30.7*  MCV 96.1  < > 98.7 98.7 96.8  PLT 205  < > 245 225 289  < > = values in this interval not displayed. No results found for: TSH No results found for: HGBA1C No results found for: CHOL, HDL, LDLCALC, LDLDIRECT, TRIG, CHOLHDL  Significant Diagnostic Results in last 30 days:  Dg Chest Portable 1 View  11/27/2015  CLINICAL DATA:  Shortness of breath and cough EXAM: PORTABLE CHEST 1 VIEW COMPARISON:  05/02/2015 FINDINGS: Cardiac shadow is stable. Elevation of left hemidiaphragm is again seen. Left basilar infiltrate is noted. The right lung is clear. No bony abnormality is seen. IMPRESSION: Left basilar infiltrate. Electronically Signed   By: Alcide Clever M.D.   On: 11/27/2015 12:26   Dg Foot Complete Right  11/27/2015  CLINICAL DATA:  Right foot pain, no known injury, initial encounter EXAM: RIGHT FOOT COMPLETE - 3+ VIEW COMPARISON:  None. FINDINGS: Generalized osteopenia is noted. No bony erosion is identified. No acute fracture dislocation is seen. IMPRESSION: No acute abnormality noted. Electronically  Signed   By: Alcide Clever M.D.   On: 11/27/2015 12:27    Assessment/Plan #1 pain management with history of osteoarthritis-this appears to be slowly improving tramadol as been increased every 4 hours when necessary appears to be doing better with therapy per discussion with therapist today.  #  2 history of pneumonia-she has completed antibiotics-O2 saturations appear to be satisfactory today In the 90s on room air-she continues on Mucinex as well as nebulizers routine as well as a when necessary dose-she does not really appear symptomatic of pneumonia at this point chest x-ray as noted above suspected showing some residual of the pneumonia there are orders to recheck this in approximate 3 weeks.  At this point monitor for any increased cough or congestion or or fever-signs at there may be an active pneumonia  #3 anemia Hemoglobin show stability at 10.1   [id number] of note greater than 25 minutes spent assessing patient reviewing her chart discussing her status with nursing staff as well as with therapy-assessing her including assessing her status during therapy-and coordinating plan of care         London Sheer, [place] [phone]

## 2015-12-06 NOTE — Telephone Encounter (Signed)
[facility]  

## 2015-12-11 ENCOUNTER — Encounter: Payer: Self-pay | Admitting: *Deleted

## 2015-12-11 ENCOUNTER — Other Ambulatory Visit: Payer: Self-pay | Admitting: *Deleted

## 2015-12-11 NOTE — Patient Outreach (Signed)
[facility] Care Management  12/11/2015  Aretha Parrotudrey R Warshaw 05/01/1923 [id number]  Mrs. Charlsie Merlesudrey Shimon was admitted to the hospital on 11/27/15 and subsequently was transferred to [facility] for continued rehabilitation care. Mr. Lorna FewScott Forrest, LCSW continues to follow Mrs. Altic's progress at [facility] and will conrsult nursing for community case management services when/if needed at discharge.   Plan: I will close Mrs. Carns's case to nursing services until she is discharged from SNF to home and will happily resume nursing services at her discharge.    [name] MHA,BSN,RN,CCM [facility] Care Management  [phone]

## 2015-12-14 ENCOUNTER — Ambulatory Visit: Payer: PPO | Admitting: Licensed Clinical Social Worker

## 2015-12-14 ENCOUNTER — Other Ambulatory Visit: Payer: Self-pay | Admitting: Licensed Clinical Social Worker

## 2015-12-14 NOTE — Patient Outreach (Signed)
Assessment:  CSW spoke via phone with Alyssa Jacobson on 12/14/15.  CSW verified identity of Alyssa ScarletCorinda Jacobson. Alyssa and CSW spoke of client needs. Client is residing currently at [facility] [place]. Client is receiving nursing care and physical therapy support services at facility. Client is eating adequately and sleeping adequately. Client has support from her daughter, Alyssa ScarletCorinda Jacobson.  Alyssa and CSW spoke of client care plan. CSW encouraged that client participate in all scheduled physical therapy sessions for client in next 30 days at facility. Alyssa said that client is benefiting from physical therapy sessions. Alyssa said the basic plan is for client to discharge from [facility] next Tuesday afternoon, December 19, 2015. Client previously had in home support 24/7 for care in the home.  CSW spoke with Alyssa about her speaking with Eliott Nineam Morris, facility social worker,  bout finalizing discharge plan for client.  Alyssa said she would speak with Charlann Langeami Morris regarding finalizing client discharge plan.  CSW encouraged Alyssa to call CSW at [phone] as needed to discuss social work needs of client.    Pan:  Client to participate in all scheduled physical therapy sessions for client in next 30 days at facility. CSW to call client/Alyssa Janee Mornhompson in 2 weeks to assess needs of client at that time.  Kelton PillarMichael S.Lior Hoen MSW, LCSW Licensed Clinical Social Worker [facility] Care Management [phone]

## 2015-12-15 ENCOUNTER — Ambulatory Visit: Payer: PPO | Admitting: Licensed Clinical Social Worker

## 2015-12-15 ENCOUNTER — Encounter: Payer: Self-pay | Admitting: Internal Medicine

## 2015-12-15 ENCOUNTER — Non-Acute Institutional Stay (SKILLED_NURSING_FACILITY): Payer: PPO | Admitting: Internal Medicine

## 2015-12-15 DIAGNOSIS — J189 Pneumonia, unspecified organism: Secondary | ICD-10-CM | POA: Diagnosis not present

## 2015-12-15 DIAGNOSIS — S72141D Displaced intertrochanteric fracture of right femur, subsequent encounter for closed fracture with routine healing: Secondary | ICD-10-CM

## 2015-12-15 DIAGNOSIS — F0390 Unspecified dementia without behavioral disturbance: Secondary | ICD-10-CM

## 2015-12-15 DIAGNOSIS — R531 Weakness: Secondary | ICD-10-CM

## 2015-12-15 NOTE — Progress Notes (Signed)
Location:  [facility] Room Number: 130/P Place of Service:  SNF (31) Provider:  Phylis Bougie, MD  Patient Care Team: Benita Stabile, MD as PCP - General (Internal Medicine) Isaiah Blakes, LCSW as [facility] Care Management (Licensed Clinical Social Worker) Nunzio Cory, RN as [facility] Care Management  Extended Emergency Contact Information Primary Emergency Contact: AlyssaCorinda Address: [address]          [place], [place] [address] [place] Home Phone: [phone] Relation: Daughter Secondary Emergency Contact: Alyssa Jacobson [place] Home Phone: [phone] Mobile Phone: [phone] Relation: Son   Goals of care: Advanced Directive information Advanced Directives 12/15/2015  Does patient have an advance directive? Yes  Type of Estate agent of [place];Out of facility DNR (pink MOST or yellow form)  Does patient want to make changes to advanced directive? No - Patient declined  Copy of advanced directive(s) in chart? Yes     Chief Complaint  Patient presents with  . Discharge Note    HPI:  Pt is a 82 y.o. female seen today for Discharge-she was here for strengthening after hospitalization for left lower lobe pneumonia-she has completed her antibiotic therapy and this appears to have improved she actually has gained strength with physical therapy although she still has weakness certainly with a history of severe dementia.  Mental status also appears improved O she first came here we could not really communicate but now she does answer questions and appears to be more comfortable and she states this as well.  She has a previous history of of right hip fracture that was surgically repaired.  She will be going home with family which is very supportive and she will need continued PT and OT for strengthening as well as home health evaluation  with nursing Past Medical History  Diagnosis Date  . Macular degeneration of both eyes   . Hypertension   . High cholesterol   . Hip fracture (HCC)   . Ankle fracture   . Dementia    Past Surgical History  Procedure Laterality Date  . Femur im nail Right 05/03/2015    Procedure: INTRAMEDULLARY (IM) NAIL FEMORAL;  Surgeon: Sheral Apley, MD;  Location: [facility] OR;  Service: Orthopedics;  Laterality: Right;    Allergies  Allergen Reactions  . Sulfa Antibiotics Other (See Comments)    unknown    Current Outpatient Prescriptions on File Prior to Visit  Medication Sig Dispense Refill  . acetaminophen (TYLENOL) 325 MG tablet Take 2 tablets (650 mg total) by mouth every 6 (six) hours as needed for mild pain (or Fever >/= 101).    Marland Kitchen ALPRAZolam (XANAX) 0.5 MG tablet Take 1 tablet (0.5 mg total) by mouth 2 (two) times daily as needed for anxiety. 30 tablet 0  . ascorbic acid (VITAMIN C) 500 MG tablet Take 500 mg by mouth daily.    Lucilla Lame [place]-Castor Oil (VENELEX) OINT Apply to sacrum and buttocks for prevention every shift    . benzonatate (TESSALON) 100 MG capsule Take 200 mg by mouth 2 (two) times daily as needed for cough.    . Calcium Carb-Cholecalciferol (CALCIUM 600 + D PO) Take 1 tablet by mouth 2 (two) times daily.    Marland Kitchen donepezil (ARICEPT) 5 MG tablet Take 1 tablet by mouth daily.    . folic acid (FOLVITE) 400 MCG tablet Take 400 mcg by mouth daily.    Marland Kitchen  guaiFENesin (MUCINEX) 600 MG 12 hr tablet Take 1 tablet (600 mg total) by mouth 2 (two) times daily.    Marland Kitchen ipratropium-albuterol (DUONEB) 0.5-2.5 (3) MG/3ML SOLN Take 3 mLs by nebulization 3 (three) times daily. 360 mL   . ipratropium-albuterol (DUONEB) 0.5-2.5 (3) MG/3ML SOLN 3 ml inhalation q 4 hours prn continue tid routine as needed    . Multiple Vitamins-Minerals (PRESERVISION AREDS PO) Take 1 tablet by mouth daily.    Marland Kitchen omeprazole (PRILOSEC) 20 MG capsule Take 20 mg by mouth daily.    . risperiDONE (RISPERDAL) 1 MG tablet Take  1 mg by mouth at bedtime.    . traMADol (ULTRAM) 50 MG tablet Take 1/2 tablet by mouth every 4 hours as needed for pain 90 tablet 0   No current facility-administered medications on file prior to visit.     Review of Systems again this is limited secondary to severe dementia but she is not complaining of any shortness of breath or cough at this time nursing staff reports she is doing better stronger she is more communicative.  When I saw physical therapy recently they also were pleased with her progress saying she was cooperating more and appear to be getting somewhat stronger  Immunization History  Administered Date(s) Administered  . Pneumococcal-Unspecified 04/01/2015   Pertinent  Health Maintenance Due  Topic Date Due  . DEXA SCAN  11/30/2016 (Originally 06/04/1990)  . INFLUENZA VACCINE  01/30/2016  . PNA vac Low Risk Adult (2 of 2 - PCV13) 03/31/2016   Fall Risk  12/14/2015 12/05/2015 11/08/2015 10/30/2015 10/26/2015  Falls in the past year? Yes Yes Yes Yes Yes  Number falls in past yr: 2 or more 2 or more - 2 or more 2 or more  Injury with Fall? No No No No No  Risk Factor Category  High Fall Risk High Fall Risk High Fall Risk High Fall Risk High Fall Risk  Risk for fall due to : History of fall(s);Impaired balance/gait;Impaired mobility;Mental status change History of fall(s);Impaired balance/gait;Impaired mobility;Mental status change History of fall(s);Impaired balance/gait;Impaired mobility;Mental status change History of fall(s);Impaired balance/gait;Impaired vision;Mental status change History of fall(s);Impaired balance/gait;Impaired vision;Mental status change  Follow up Education provided;Falls prevention discussed Education provided;Falls prevention discussed Education provided;Falls prevention discussed Falls prevention discussed Falls prevention discussed   Functional Status Survey:    Filed Vitals:   12/15/15 1029  BP: 130/72  Pulse: 68  Temp: 98.2 F (36.8 C)    TempSrc: Oral  Resp: 20  Height:  (1.6 m)  Weight: 121 lb 12.8 oz (55.248 kg)   Body mass index is 21.58 kg/(m^2). Physical Exam   In general this is a frail elderly female in no distress sitting comfortably in her wheelchair.  Her skin is warm and dry.  Eyes pupils appear reactive light her visual acuity appears grossly intact.  Oropharynx is clear mucous membranes appear fairly moist.  She is largely edentulous she has a partial plate.  Chest is clear to auscultation there is no labored breathing.  Heart is regular rate and rhythm with a 2/6 murmur she has trace lower extremity edema.  Abdomen is soft nontender positive bowel sounds.  Muscle skeletal she is able to move all extremities 4 largely ambulates in a wheelchair she appears to have gained some strength appears but she lower extremities during this admission although again she has significant weakness.  Neurologic is grossly intact her speech is clear no lateralizing findings.  Psych she is oriented to self only she  is better now following verbal commands when asked if she is hurting she can reply says she is not hurting-she actually does respond to questions although there is confusion this appears to be improved from certainly when she first came  Labs reviewed:  Recent Labs  11/27/15 1200 11/29/15 0430 12/06/15 0630  NA 135 133* 136  K 4.2 3.5 4.0  CL 99* 97* 99*  CO2 29 30 31   GLUCOSE 116* 131* 91  BUN 19 20 17   CREATININE 0.87 0.63 0.56  CALCIUM 8.9 8.8* 8.9    Recent Labs  05/02/15 2038 05/03/15 0044 11/27/15 1259  AST 25 14* 18  ALT 14 28 10*  ALKPHOS 48 44 47  BILITOT 0.6 0.4 0.7  PROT 6.5 6.1* 6.7  ALBUMIN 3.7 3.2* 3.0*    Recent Labs  05/03/15 0044  11/27/15 1200 11/29/15 0430 12/06/15 0630  WBC 16.5*  < > 10.9* 8.7 6.7  NEUTROABS 14.1*  --  8.1*  --  3.8  HGB 11.4*  < > 12.0 9.8* 10.1*  HCT 34.8*  < > 38.3 30.4* 30.7*  MCV 96.1  < > 98.7 98.7 96.8  PLT 205  < > 245  225 289  < > = values in this interval not displayed. No results found for: TSH No results found for: HGBA1C No results found for: CHOL, HDL, LDLCALC, LDLDIRECT, TRIG, CHOLHDL  Significant Diagnostic Results in last 30 days:  Dg Chest Portable 1 View  11/27/2015  CLINICAL DATA:  Shortness of breath and cough EXAM: PORTABLE CHEST 1 VIEW COMPARISON:  05/02/2015 FINDINGS: Cardiac shadow is stable. Elevation of left hemidiaphragm is again seen. Left basilar infiltrate is noted. The right lung is clear. No bony abnormality is seen. IMPRESSION: Left basilar infiltrate. Electronically Signed   By: Alcide CleverMark  Lukens M.D.   On: 11/27/2015 12:26   Dg Foot Complete Right  11/27/2015  CLINICAL DATA:  Right foot pain, no known injury, initial encounter EXAM: RIGHT FOOT COMPLETE - 3+ VIEW COMPARISON:  None. FINDINGS: Generalized osteopenia is noted. No bony erosion is identified. No acute fracture dislocation is seen. IMPRESSION: No acute abnormality noted. Electronically Signed   By: Alcide CleverMark  Lukens M.D.   On: 11/27/2015 12:27    Assessment/Plan 1 history of pneumonia-she has completed antibiotic therapy this appears to be stable she continues on duo nebs 3 times a day and every 4 hours when necessary.  She does not complain of shortness of breath or cough this appears to have improved during her stay here. Continues on Mucinex 2 times a day as well  #2 history of dementia this appears quite significant she is on low-dose Aricept 5 mg a day she is also on Risperdal 1 mg daily at bedtime her behaviors appear to have improved during her stay here there appears to be list anxiety-she also has Xanax 0.5 mg twice a day as needed.  #3 history of right hip fracture with repair again this has been followed by orthopedics this appears stable she is on calcium will need continued PT and OT.  #4-suspected history GERD she continues on a PPI this has not really been an issue during her stay here.  . Again she will be  going home with family that is quite supportive-will need nursing home health assessment for her multiple medical issues and what appears to be progressing dementia.  [id number]-of note greater than 30 minutes spent on this discharge summary-of note greater than 50% of time spent coordinating plan of care  Oralia Manis, [place]

## 2015-12-19 ENCOUNTER — Encounter: Payer: Self-pay | Admitting: Internal Medicine

## 2015-12-19 NOTE — Progress Notes (Signed)
This encounter was created in error - please disregard.

## 2015-12-20 ENCOUNTER — Other Ambulatory Visit: Payer: Self-pay | Admitting: *Deleted

## 2015-12-20 NOTE — Patient Outreach (Signed)
[facility]) Care Management  12/20/2015  Alyssa Jacobson 04/17/1923 [id number]  Alyssa Jacobson is an 82 y.o. female with past medical history of hypertension, hyperlipidemia, dementia, and macular degeneration. Mrs. Alyssa Jacobson had 3 falls prior to a right hip fracture which required surgical repair in November 2016 and has had 2 falls since her surgery. Mrs. Alyssa Jacobson spent a month at a skilled nursing facility after her hospitalization and now is home where she requires 24/7 care mostly because of her dementia. She has had several urinary tract infections over the last 6 months.   Mrs. Alyssa Jacobson was admitted to the hospital on 11/27/15 with CAP and discharged to [facility] for rehab on 11/29/15. She was discharged to home yesterday. I spoke with her daughter Alyssa Jacobson who reports that Mrs. Alyssa Jacobson arrived home around 3pm and slept until 3am when she awoke "screaming and yelling out of her head so much they (paid in home caregivers) had to give her a pill (Xanax) ". Mrs. Alyssa Jacobson has been somnolent throughout the day today per her daughter's report and has yet to eat. She was discharged to home on O2 and nebulization treatments. DME was delivered by [facility] yesterday. Mrs. Alyssa Jacobson has all prescribed medications and a scheduled follow up appointment with the DNP at her primary care office on 01/01/16.   Plan: I will see Alyssa Jacobson at home next week on Tuesday for face to face visit/assessment.    Marja Kayslisa Kindra Jacobson MHA,BSN,RN,CCM [facility] Care Management  [phone]

## 2015-12-26 ENCOUNTER — Other Ambulatory Visit: Payer: Self-pay | Admitting: *Deleted

## 2015-12-26 NOTE — Patient Outreach (Addendum)
[facility]) Care Management   12/26/2015  CICLEY GANESH 1923/06/27 [id number]  Alyssa Jacobson is an 82 y.o. female with past medical history of hypertension, hyperlipidemia, dementia, and macular degeneration. Alyssa Jacobson had 3 falls prior to a right hip fracture which required surgical repair in November 2016 and has had 2 falls since her surgery. Alyssa Jacobson spent a month at a skilled nursing facility after her hospitalization and now is home where she requires 24/7 care mostly because of her dementia. She has had several urinary tract infections over the last 6 months.   Alyssa Jacobson was admitted to the hospital on 11/27/15 with CAP and discharged to [facility] for rehab on 11/29/15. She was discharged to home on O2 and nebulization treatments. DME was delivered by [facility]. Alyssa Jacobson is taking all prescribed medications and a has a scheduled follow up appointment with the DNP at her primary care office on 01/01/16.   Subjective: "She's been yelling out and not sleeping a bit since she got home from the nursing home."  Objective:  BP 130/72 mmHg  Pulse 71  SpO2 96%  Review of Systems  Constitutional: Positive for malaise/fatigue.  HENT: Negative.   Eyes:       Decreased vision  Respiratory: Negative.   Cardiovascular: Negative.   Gastrointestinal: Positive for nausea, abdominal pain and constipation.  Genitourinary: Negative.        Incontinent of urine  Musculoskeletal: Positive for myalgias.  Skin:       Circumferential pink to red skin on sacrum with interruption of skin integrity on fold of left gluteus 1.5cm adjacent to rectum  Neurological: Positive for weakness.  Psychiatric/Behavioral: Positive for memory loss. The patient has insomnia.     Physical Exam  Constitutional: She appears well-developed and well-nourished. She appears lethargic. She has a sickly appearance.  Cardiovascular: Normal rate and regular rhythm.    Murmur heard. Respiratory: Effort normal. No respiratory distress. She has no wheezes. She has no rhonchi. She has no rales.  GI: Soft. Normal appearance. Bowel sounds are decreased.  Neurological: She appears lethargic.  Skin: Skin is warm.     Psychiatric: Her speech is normal. She is agitated. Cognition and memory are impaired. She exhibits abnormal recent memory and abnormal remote memory.    Encounter Medications:   Outpatient Encounter Prescriptions as of 12/26/2015  Medication Sig  . acetaminophen (TYLENOL) 325 MG tablet Take 2 tablets (650 mg total) by mouth every 6 (six) hours as needed for mild pain (or Fever >/= 101).  Marland Kitchen ALPRAZolam (XANAX) 0.5 MG tablet Take 1 tablet (0.5 mg total) by mouth 2 (two) times daily as needed for anxiety.  Marland Kitchen ascorbic acid (VITAMIN C) 500 MG tablet Take 500 mg by mouth daily.  Lucilla Lame [place]-Castor Oil (VENELEX) OINT Apply to sacrum and buttocks for prevention every shift  . benzonatate (TESSALON) 100 MG capsule Take 200 mg by mouth 2 (two) times daily as needed for cough.  . Calcium Carb-Cholecalciferol (CALCIUM 600 + D PO) Take 1 tablet by mouth 2 (two) times daily.  Marland Kitchen donepezil (ARICEPT) 5 MG tablet Take 1 tablet by mouth daily.  . folic acid (FOLVITE) 400 MCG tablet Take 400 mcg by mouth daily.  Marland Kitchen guaiFENesin (MUCINEX) 600 MG 12 hr tablet Take 1 tablet (600 mg total) by mouth 2 (two) times daily.  Marland Kitchen ipratropium-albuterol (DUONEB) 0.5-2.5 (3) MG/3ML SOLN Take 3 mLs by nebulization 3 (three) times daily.  Marland Kitchen ipratropium-albuterol (DUONEB)  0.5-2.5 (3) MG/3ML SOLN 3 ml inhalation q 4 hours prn continue tid routine as needed  . Multiple Vitamins-Minerals (MULTIVITAMIN WITH MINERALS) tablet Take 1 tablet by mouth daily.  . Nutritional Supplements (ENSURE ENLIVE PO) Give Ensure Enlive once a day  . omeprazole (PRILOSEC) 20 MG capsule Take 20 mg by mouth daily.  . OXYGEN Apply 02 @@2  L/ mn via N/C to maintain sats>90%  . risperiDONE (RISPERDAL) 1 MG tablet  Take 1 mg by mouth at bedtime.  . traMADol (ULTRAM) 50 MG tablet Take 1/2 tablet by mouth every 4 hours as needed for pain   Assessment:  82 year old female patient living in [place] in her home with 24/7 private duty caregivers whose health is generally declining and family is inquiring about care options and treatment for acute health concerns. Alyssa Jacobson is much less engaged and attentive today than usual although, at baseline she is confused. She did not make eye contact with me. She was awake during my entire visit. She did not recognize her caregiver or know where she was. She could not answer questions appropriately.   Recent Acute Health Condition (pneumonia) - Alyssa Jacobson finished treatment for pneumonia while hospitalized. Breath sounds are clear today. O2 sat on 2L O2 via nasal cannula = 95%. Caregivers deny that she has had fever or cough.   Level of Care concerns - Alyssa Jacobson is cared for at home 24/7 by her daughter and private duty caregivers. Her daughter is trying to access benefits to assist with in home care. [facility] has been to the home to perform an evaluation and has requested completion of documentation sent to primary care provider.   Deconditioning - Alyssa Jacobson is significantly deconditioned and is unable to stand or assist with standing at all during my visit. This is a change from my lvisits a few months ago when Alyssa Jacobson was able to hold onto her walker and take several steps, assisted. I spoke with her physical therapist, Renae Fickleaul ([facility]) by phone during my visit. Renae Fickleaul is updated on the skin conditions below and plans to see Alyssa Jacobson this afternoon after 3pm.   Acute Health Condition (interruption of skin integrity) - Alyssa Jacobson has pink/red skin on the sacrum with skin tear on left gluteual fold adjacent to rectum; she also has 2cm x 2.5cm area of pink/red/gray discoloration of right heel which is tender to touch and warm; provider  notified during visit                                                                 Acute Health Condition (Constipation) - Alyssa Jacobson has not had a bowel movement since Friday according to her CNA; the family had Dulcolax on hand and gave one to Alyssa Jacobson today; during my visit, Alyssa Jacobson was helped to get to the bedside commode and had a small formed BM but kept calling out "help me, I can't do it"; according to Mrs. Luttrull's caregiver, Alyssa Jacobson has been eating well, 3 meals daily. She is drinking her usual amount, including a nutrition shake daily.   Plan:   Primary care provider notified of skin concerns by phone during visit.  I will follow up with Mrs. Danzy's caregivers and daughter by phone  with any recommendations given by Dr. Margo AyeHall.   [facility] CM Care Plan Problem One        Most Recent Value   Care Plan Problem One  Acute Skin conditions   Role Documenting the Problem One  Care Management Coordinator   Care Plan for Problem One  Active   [facility] Long Term Goal (31-90 days)  Over the next 31 days, patient's family/cargivers will verbalize understanding of treatment plan for  skin conditions   [facility] Long Term Goal Start Date  12/26/15   Interventions for Problem One Long Term Goal  Physician notified of skin conditions,  assisted CNA with cleaning patient and applying prescribed oitnment,  instructed CNA with hands on demonstration how to elevate foot and keep extremity off surfaces   [facility] CM Short Term Goal #1 (0-30 days)  Over the next 30 days, patient's caregivers will employ any changes to treatment plan as instructed by provider   [facility] Short Term Goal #1 Start Date  12/26/15   Interventions for Short Term Goal #1  Advised patient's caregiver to await call from provider office prior to making any changes in care   [facility]al #2 (0-30 days)  Over the next 30 days, patient's caregivers will apply prescribed ointment to sacral skin   [facility] CM Short Term  Goal #2 Start Date  12/26/15   Interventions for Short Term Goal #2  Reviewed prescribing instructions with patient caregiver    [facility] Problem Two        Most Recent Value   Care Plan Problem Two  Acute Health Condition (constipation) as evidenced by cargiver report   Role Documenting the Problem Two  Care Management Coordinator   Care Plan for Problem Two  Active   Interventions for Problem Two Long Term Goal   Notified provider of new problem,  discussed diet/nutrition with patient caregivers,  medication reviewed   [facility] Long Term Goal (31-90) days  Over the next 31 days, patient caregivers will verbalize understanding of long term plan of care for management of constipation   [facility] Long Term Goal Start Date  12/26/15   [facility] CM Short Term Goal #1 (0-30 days)  Over the next 30 days, patient caregivers will only give medications prescribed by provider for treatment of constipation   [facility] CM Short Term Goal #1 Start Date  12/26/15   Interventions for Short Term Goal #2   Reviewed importance of not using over the counter laxatives and enemas and suppositories with the advice of physician      [name],BSN,RN,CCM [facility] Care Management  [phone]

## 2015-12-28 ENCOUNTER — Other Ambulatory Visit: Payer: Self-pay | Admitting: Licensed Clinical Social Worker

## 2015-12-28 ENCOUNTER — Other Ambulatory Visit: Payer: Self-pay | Admitting: *Deleted

## 2015-12-28 NOTE — Patient Outreach (Signed)
Assessment:  CSW spoke via phone with Salley Scarletorinda Thompson on 12/28/15. CSW verified identity of [name]. CSW received verbal permission from [place] Thompson on 12/28/15 for CSW to speak with Corinda about needs of client.  Corinda and CSW spoke of client needs. Client has macular degeneration. She also has a diagnosis of dementia. She recently was discharged home from local skilled nursing facility. She requires care 24/7 in the home environment. She has needed equipment for in home support. She is receiving in home physical therapy sessions, as scheduled, with [facility]. [facility] support with RN Marja KaysAlisa Gilboy.  Client uses oxygen as prescribed in the home environment to help her with breathing. Client also has nebulizer and is taking nebulizer treatments as prescribed. Client has strong support from her daughter, [name]. Client also has support from home health aides assisting client, as scheduled, in the home. Client sees Dr. Margo AyeHall as primary care doctor. CSW and Corinda spoke of client care plan. CSW encouraged that client participate in all scheduled in home physical therapy sessions for client in the next 30 days with [facility].  CSW also encouraged Corinda to call RN Marja KaysAlisa Gilboy as needed to discuss nursing needs of client.  Corinda informed CSW that client had a heel ulcer on right heel and that heel ulcer was oozing liquid at present. She said that client was  resting in bed at present.  RN Marja KaysAlisa Gilboy had requested yesterday that Dr. Margo AyeHall send order for home health nursing for client especially for wound care for client. Client has been fatigued and weak in recent weeks. CSW informed Corinda that CSW would communicate the above information to RN Marja KaysAlisa Gilboy on 12/28/15. Corinda agreed to this plan. Corinda and caregivers are providing care support for client 24/7 at home of client.  CSW encouraged Corinda to call RN Marja KaysAlisa Gilboy related to nursing needs of  client. CSW encouraged Corinda to call CSW at [phone] as needed to discuss social work needs of client.   Following phone call with Salley Scarletorinda Thompson, CSW then called. RN Marja KaysAlisa Gilboy on 12/28/15 and informed [name] of the above client information   Plan:  Client to participate in all scheduled in home physical therapy sessions for client in the next 30 days with [facility]. CSW to collaborate with RN Marja KaysAlisa Gilboy in monitoring needs of client. CSW to call client/Corinda Janee Mornhompson in 4 weeks to assess needs of client at that time.  Kelton PillarMichael S.Arturo Freundlich MSW, LCSW Licensed Clinical Social Worker [facility] Care Management [phone]

## 2015-12-28 NOTE — Patient Outreach (Addendum)
[facility](THN) Care Management  12/28/2015  Alyssa Jacobson 09/24/1922 [id number]  Follow up with Dr. Scharlene GlossHall's office today re: Alyssa Jacobson's skin condition/concerns. I spoke with [name], Practice Administrator to let her know of Alyssa Jacobson's daughter and caregiver's report today that there is "some oozing and bleeding" coming from the right heel as of today. Sybil indicated that she would notify Dr. Margo AyeHall and inquire with him re: further plans for treatment. HHPT is seeing [name] ([facility]) and is holding any activities that require standing.   Sybil returned a call to me indicating that Dr. Margo AyeHall has prescribed an oral antibiotic for Alyssa Jacobson and home health nursing services for wound care. Sybil called Alyssa Jacobson's daughter to update her on the plan.   Plan: I will follow up with [name] no later than Monday.    Alyssa Jacobson MHA,BSN,RN,CCM [facility] Care Management  [phone]

## 2016-01-01 ENCOUNTER — Other Ambulatory Visit: Payer: Self-pay | Admitting: *Deleted

## 2016-01-01 NOTE — Patient Outreach (Signed)
[facility](THN) Care Management  01/01/2016  Alyssa Jacobson 01/01/1923 [id number]   Alyssa Jacobson is an 82 y.o. female with past medical history of hypertension, hyperlipidemia, dementia, and macular degeneration. Alyssa Jacobson had 3 falls prior to a right hip fracture which required surgical repair in November 2016 and has had 2 falls since her surgery. Alyssa Jacobson spent a month at a skilled nursing facility after her hospitalization and now is home where she requires 24/7 care mostly because of her dementia. She has had several urinary tract infections over the last 6 months.   Alyssa Jacobson was admitted to the hospital on 11/27/15 with CAP and discharged [facility] for [facility] on 11/29/15. She was discharged to home on O2 and nebulization treatments. DME was delivered by [facility]. Alyssa Jacobson is taking all prescribed medications and a has a scheduled follow up appointment with the DNP at her primary care office on 01/01/16.   Acute Health Condition - per report of Alyssa Jacobson's private duty caregiver, Alyssa Jacobson had "a lot of congestion this morning" and her morning O2 sat on O2 @ 2l/[place] was 88%. A breathing treatment was given and Alyssa Jacobson's O2 sat came up to 96%. In addition, her grand-daughter started her on Mucinex yesterday. Alyssa Jacobson's caregiver says she won't eat today and is "having a dementia spell" (i.e. Yelling out, not following directions). She has been given prescribed Xanax in the last 30 minutes.   I have updated Dr. Margo AyeHall on Alyssa Jacobson's condition as of today.   Acute Health Condition (Interruption of Skin Integrity) - when I visited Alyssa Jacobson at home last week, I noted skin breakdown of the sacrum and intact skin but discoloration of the right heel. I notified Dr. [name], her primary care provider, who prescribed oral antibiotics and home health nursing services for skin/wound care.   I called Mrs.  Jacobson's daughter, Alyssa Jacobson, today to follow up on Alyssa Jacobson's progress. I was unable to reach Mrs. Alyssa Jacobson but spoke with Alyssa Jacobson's caregiver. She told me that antibiotics were not delivered. I reached out to [facility] pharmacy and spoke with the pharmacist who confirmed receipt of the prescription and told me it would be delivered today with the 12n shipment. The home health nursing services have not started and I reached out to [name] at Dr. Scharlene GlossHall's office to ensure that the orders will be sent to [facility], the home care agency providing care for Alyssa Jacobson since [facility].   Plan: I will follow up with Alyssa Jacobson's family/caregiver on Wednesday. I advised them to call the 24/7 on call nurse if any issues/concerns arose on the 4th as our department is closed.    Marja Kayslisa Denai Caba MHA,BSN,RN,CCM [facility] Care Management  [phone]

## 2016-01-03 ENCOUNTER — Ambulatory Visit: Payer: Self-pay | Admitting: *Deleted

## 2016-01-03 ENCOUNTER — Other Ambulatory Visit: Payer: Self-pay | Admitting: *Deleted

## 2016-01-03 NOTE — Patient Outreach (Signed)
[facility](THN) Care Management  01/03/2016  Aretha Parrotudrey R Baar 12/08/1922 [id number]  Voice message from Casimiro NeedleMichael, PT with [facility] returned re: his conversation with Salley Scarletorinda Thompson re: goals of care decision Mrs. Janee Mornhompson initiated on behalf of her mother with Casimiro NeedleMichael.   I reached out to Mrs. Janee Mornhompson but was unable to reach her and was unable to leave a message.   Plan: I will notify Dr. Margo AyeHall of my conversation with Casimiro NeedleMichael and will reach out to Mrs. Thompson/Mrs. Merilynn Finlandobertson tomorrow.    [name] MHA,BSN,RN,CCM [facility] Care Management  [phone]

## 2016-01-04 ENCOUNTER — Other Ambulatory Visit: Payer: Self-pay | Admitting: *Deleted

## 2016-01-05 ENCOUNTER — Encounter: Payer: Self-pay | Admitting: *Deleted

## 2016-01-05 NOTE — Patient Outreach (Signed)
Alyssa Jacobson - [facility]) Care Management   01/04/2016  Alyssa Jacobson [id number]  Alyssa Jacobson is an 82 y.o. female with past medical history of hypertension, hyperlipidemia, dementia, and macular degeneration. Alyssa Jacobson had 3 falls prior to a right hip fracture which required surgical repair in November 2016 and has had 2 falls since her surgery. Alyssa Jacobson spent a month at a skilled nursing facility after her hospitalization and now is home where she requires 24/7 care mostly because of her dementia. She has had several urinary tract infections over the last 6 months.   Alyssa Jacobson was admitted to the hospital on 11/27/15 with CAP and discharged to [facility] for rehab on 11/29/15. She was discharged to home on O2 and nebulization treatments on 12/19/15. I spoke with Alyssa Jacobson, daughter/primary caregiver/HCPOA on 12/20/15. At that time, Alyssa Jacobson was scheduled for a follow up appointment with the DNP at her primary care office on 01/01/16. I visited Alyssa Jacobson at home on 12/26/15 and because of concerns about her skin on the sacrum and right heel, requested consideration of home health nursing services by Dr. Wende Neighbors. Dr. Nevada Crane ordered [facility] for assessments and skin care.   I am visiting Alyssa Jacobson, her daughter, and her primary daytime pvt duty caregiver at the request of Alyssa Jacobson to discuss "where to go from here and if hospice might be the best thing." Alyssa Jacobson called Dr. Juel Burrow office yesterday and let the office staff know that she was interested in discussing Hospice care as an option for her mother   Subjective: "I know she wouldn't want to keep going back and forth to the hospital and she told me she didn't want to end up in a nursing home. She's not getting better and she's not responding to [place] anymore. She hasn't known who I am since when she went to the nursing home."  Objective:  BP 102/64 mmHg  Pulse 79  Resp 13   SpO2 97%  Review of Systems  Constitutional: Positive for weight loss and malaise/fatigue. Negative for fever, chills and diaphoresis.       Profound weakness; cannot follow commands; unable to assist at all with transfer from chair to bed  Respiratory: Negative for cough, sputum production and wheezing.   Cardiovascular: Negative for leg swelling.  Gastrointestinal: Negative for vomiting, diarrhea and constipation.       Incontinent of stool  Genitourinary:       Incontinent of urine  Musculoskeletal: Negative for falls.  Neurological: Positive for weakness. Negative for loss of consciousness.    Physical Exam  Constitutional: Vital signs are normal. She appears listless. She is uncooperative. She has a sickly appearance. She appears ill. No distress. Nasal cannula in place.  Cardiovascular: Normal rate and regular rhythm.   Respiratory: No respiratory distress. She has no wheezes. She has rhonchi in the right lower field. She has no rales.  Patient takes even more shallow breaths; inspiratory phase = expiratory phase; will not take deep breaths to command  GI: Soft. Normal appearance. She exhibits no distension. Bowel sounds are decreased. There is no tenderness.  Bowel sounds diminished  Musculoskeletal:       Right knee: She exhibits decreased range of motion.       Left knee: She exhibits decreased range of motion.  Neurological: She appears listless. Coordination and gait abnormal.  Skin: Skin is warm and dry.     See skin exam notes  Psychiatric: Her affect  is blunt. She is withdrawn. She is noncommunicative.    Encounter Medications:   Outpatient Encounter Prescriptions as of 01/04/2016  Medication Sig  . acetaminophen (TYLENOL) 325 MG tablet Take 2 tablets (650 mg total) by mouth every 6 (six) hours as needed for mild pain (or Fever >/= 101).  Marland Kitchen ALPRAZolam (XANAX) 0.5 MG tablet Take 1 tablet (0.5 mg total) by mouth 2 (two) times daily as needed for anxiety.  Roseanne Kaufman  [place]-Castor Oil (VENELEX) OINT Apply to sacrum and buttocks for prevention every shift  . donepezil (ARICEPT) 5 MG tablet Take 1 tablet by mouth daily.  . folic acid (FOLVITE) 272 MCG tablet Take 400 mcg by mouth daily.  Marland Kitchen guaiFENesin (MUCINEX) 600 MG 12 hr tablet Take 1 tablet (600 mg total) by mouth 2 (two) times daily.  Marland Kitchen ipratropium-albuterol (DUONEB) 0.5-2.5 (3) MG/3ML SOLN Take 3 mLs by nebulization 3 (three) times daily.  Marland Kitchen ipratropium-albuterol (DUONEB) 0.5-2.5 (3) MG/3ML SOLN 3 ml inhalation q 4 hours prn continue tid routine as needed  . omeprazole (PRILOSEC) 20 MG capsule Take 20 mg by mouth daily.  . OXYGEN Apply 02 @@2  L/ mn via N/C to maintain sats>90%  . risperiDONE (RISPERDAL) 1 MG tablet Take 1 mg by mouth at bedtime.  . traMADol (ULTRAM) 50 MG tablet Take 1/2 tablet by mouth every 4 hours as needed for pain  . ascorbic acid (VITAMIN C) 500 MG tablet Take 500 mg by mouth daily. Reported on 01/05/2016  . benzonatate (TESSALON) 100 MG capsule Take 200 mg by mouth 2 (two) times daily as needed for cough. Reported on 01/05/2016  . Calcium Carb-Cholecalciferol (CALCIUM 600 + D PO) Take 1 tablet by mouth 2 (two) times daily. Reported on 01/05/2016  . Multiple Vitamins-Minerals (MULTIVITAMIN WITH MINERALS) tablet Take 1 tablet by mouth daily. Reported on 01/05/2016  . Nutritional Supplements (ENSURE ENLIVE PO) Reported on 01/05/2016    Functional Status:   In your present state of health, do you have any difficulty performing the following activities: 01/04/2016 11/27/2015  Hearing? N N  Vision? Y N  Difficulty concentrating or making decisions? Alyssa Jacobson  Walking or climbing stairs? Y Y  Dressing or bathing? Y Y  Doing errands, shopping? Y N  Preparing Food and eating ? Y -  Using the Toilet? Y -  In the past six months, have you accidently leaked urine? Y -  Do you have problems with loss of bowel control? Y -  Managing your Medications? Y -  Managing your Finances? Y -  Housekeeping or  managing your Housekeeping? Y -    Fall/Depression Screening:    PHQ 2/9 Scores 12/14/2015 12/05/2015 11/08/2015 10/30/2015 10/26/2015  PHQ - 2 Score 2 2 - 2 -  PHQ- 9 Score 7 7 - 7 -  Exception Documentation - - Medical reason - Medical reason    Assessment:  82 year old female patient living in [place] in her home with 24/7 private duty caregivers whose health is generally declining and family is inquiring about care options and treatment for acute health concerns. Alyssa Jacobson is not responding to any verbal cues today. She is sitting in her recliner chair, eyes half open, mouth open, unable to follow commands. She is not verbal. She does not give any visual or verbal cues of how she feels when moved from chair to bed. Alyssa Jacobson is very stiff with limited range of motion in her knees and arms.     Recent Acute Health Condition (  pneumonia) - Alyssa Jacobson finished treatment for pneumonia while hospitalized. Breath sounds are clear today with the exception of few fine rales in right base only. O2 sat on 2.5L O2 via nasal cannula = 97%. Caregivers deny that she has had fever or cough. She is taking more shallow but even breaths. She will not take deep breath to command.   Level of Care concerns - Alyssa Jacobson is cared for at home 24/7 by her daughter and private duty caregivers. Her daughter is trying to access benefits to assist with in home care. SHIP has been to the home to perform evaluation, provided paperwork which was completed by provider. More paperwork was requested and sent by provider. Per my visit with provider office staff today, no approval has been received.   Alyssa Jacobson's daughter Jolaine Jacobson asked me specifically to come today to discuss goals of care and care options. She states that she doesn't want her mother to go back to the hospital and she doesn't want her to have to return to a nursing home. She wishes to continue to keep Alyssa Jacobson at home with private  duty caregivers and asks that Dr. Nevada Crane consider a request for Hospice services evaluation. I personally drove to Dr. Juel Burrow office to provide this information in person. Hospice referral was made and I returned a call to Alyssa Jacobson notifying her of the same and letting her know she should expect a call from [facility] (per her choice) no later than tomorrow 01/05/16, likely today. She is to return a call to Dr. Juel Burrow office or to me if she doesn't hear from Hospice by 12n tomorrow.    Acute Health Condition (interruption of skin integrity) - Alyssa Jacobson has pink/red skin on the sacrum with skin tears; she also has 2cm x 4.5cm area of red/gray/black discoloration of right heel which is larger than noted last week; the area does not have broken skin but is slightly warm to touch; Mrs. Figler also has an area of red skin .75cm x .75cm on the bony prominence of her right elbow; hydrocolloid dressing was applied and I advised family on how to keep bony prominences off surfaces; provider updated on changes from last week's examination     Plan:   I notified Dr. Juel Burrow office of assessment and daughter's desire for Hospice referral as outlined above.   Alyssa Jacobson will call Dr. Juel Burrow office if she has not heard from [facility] by [place] on 01/05/16.   I will call Alyssa Jacobson on Monday, Jacobson 10, 2017 to follow up on Hospice referral.   [facility] CM Care Plan Problem One        Most Recent Value   Care Plan Problem One  Acute Skin conditions   Role Documenting the Problem One  Care Management [place] for Problem One  Active   [facility]N Long Term Goal (31-90 days)  Over the next 31 days, patient's family/cargivers will verbalize understanding of treatment plan for  skin conditions   THN Long Term Goal Start Date  12/26/15   Interventions for Problem One Long Term Goal  Physician notified of skin  conditions,  assisted CNA with cleaning patient and applying prescribed oitnment,  instructed CNA with hands on demonstration how to elevate foot and keep extremity off surfaces   [facility]N CM Short Term Goal #1 (0-30 days)  Over the next 30 days, patient's caregivers will employ any changes to treatment plan as instructed  by provider   [facility] CM Short Term Goal #1 Start Date  12/26/15   Interventions for Short Term Goal #1  Advised patient's caregiver to await call from provider office prior to making any changes in care   [facility] CM Short Term Goal #2 (0-30 days)  Over the next 30 days, patient's caregivers will apply prescribed ointment to sacral skin   THN CM Short Term Goal #2 Start Date  12/26/15   Interventions for Short Term Goal #2  Reviewed prescribing instructions with patient caregiver,  assisted caregiver with cleaning skin and applying prescribed ointment    THN CM Care Plan Problem Two        Most Recent Value   Care Plan Problem Two  Acute Health Condition (constipation) as evidenced by cargiver report   Role Documenting the Problem Two  Care Management [place] for Problem Two  Active   Interventions for Problem Two Long Term Goal   Notified provider of new problem,  discussed diet/nutrition with patient caregivers,  medication reviewed   THN Long Term Goal (31-90) days  Over the next 31 days, patient caregivers will verbalize understanding of long term plan of care for management of constipation   THN Long Term Goal Start Date  12/26/15   [facility] CM Short Term Goal #1 (0-30 days)  Over the next 30 days, patient caregivers will only give medications prescribed by provider for treatment of constipation   [facility]N CM Short Term Goal #1 Start Date  12/26/15   Interventions for Short Term Goal #2   Reviewed importance of not using over the counter laxatives and enemas and suppositories with the advice of physician    [facility] CM Care Plan Problem Three        Most Recent Value   Care Plan  Problem Three  Knowledge Deficits related to options for goals of care as evidenced by patient's dtr/caregiver/HCPOA questions   Role Documenting the Problem Three  Care Management Coordinator   Care Plan for Problem Three  Active   THN Long Term Goal (31-90) days  Over the next 31 days, patient's daughter/primary caregiver/HCPOA will verbalize understanding of options for long term care    THN Long Term Goal Start Date  01/04/16   Interventions for Problem Three Long Term Goal  Met with patient's dtr at her request to discuss options including hospitalization,SNF care, in home care, hospice services,  notiified PCP of dtr request for Hospice eval for goals of care   [facility] CM Short Term Goal #1 (0-30 days)  Patient's daughter will call office of Dr. Wende Neighbors or me if unable to reach anyone at Dr. Juel Burrow office if she has not heard from the Hospice team no later than 12N on 01/05/16 with plans for visit no later than 01/05/16   [facility] CM Short Term Goal #1 Start Date  01/04/16   Interventions for Short Term Goal #1  Notified dtr that provider office made hospice referral and should hear from hospice team today with plans for visit no later than tomorrow      Janalyn Shy Walford Management  [phone]

## 2016-01-08 ENCOUNTER — Encounter: Payer: Self-pay | Admitting: Licensed Clinical Social Worker

## 2016-01-08 ENCOUNTER — Other Ambulatory Visit: Payer: Self-pay | Admitting: *Deleted

## 2016-01-08 ENCOUNTER — Ambulatory Visit: Payer: Self-pay | Admitting: *Deleted

## 2016-01-08 ENCOUNTER — Other Ambulatory Visit: Payer: Self-pay | Admitting: Licensed Clinical Social Worker

## 2016-01-08 ENCOUNTER — Encounter: Payer: Self-pay | Admitting: *Deleted

## 2016-01-08 NOTE — Patient Outreach (Signed)
Assessment:  CSW called RN Alyssa Jacobson on 01/08/16 to speak with RN about current status of client. RN Alyssa Jacobson informed CSW on 01/08/16 that client had [place] to [place] [facility] [place] [facility] [place]. Alyssa KaysAlisa Gilboy RN informed CSW on 01/08/16 that client had died on 01/04/2016. RN had spoken with Alyssa Jacobson, daughter of client; Forbes CellarCorinda was appreciative of all the support of [facility] staff to client in recent weeks.  Alyssa Jacobson had informed RN Alyssa Kayslisa Jacobson that client had died on 01/14/2016. Due to death of client on 01/02/2016, CSW is [facility] on 01/08/16.    Plan:  CSW is [facility] on 01/08/16 due to death of client on 01/06/2016. CSW to inform Alyssa Jacobson that CSW discharged client on 01/08/16. CSW to fax physician case closure letter to Alyssa Jacobson on 01/08/16 informing Alyssa Jacobson that CSW discharged client on [facility]/08/[facility] due to death of client on 01/29/2016.   Alyssa Jacobson MSW, LCSW Licensed Clinical Social Worker [facility] Care Management [phone]

## 2016-01-08 NOTE — Patient Outreach (Signed)
[facility]N) Care Management  01/08/2016  Alyssa Jacobson 05/04/1923 [id number]  Call received from Alyssa Jacobson, daughter/HCPOA/primary caregiver of Alyssa Jacobson, to make me aware of [name]'s passing at home on Sunday morning, January 07 2016. Alyssa Jacobson began receiving Hospice Care in the home on Friday, January 05, 2016. Alyssa Jacobson expressed her gratitude for the services provided by [facility] Management and [facility] [place].   We are grateful for the opportunity to have participated in Alyssa Jacobson's care.   Plan: Case Closure   [name] [facility],BSN,RN,CCM [facility] Care Management  [phone]

## 2016-01-26 ENCOUNTER — Ambulatory Visit: Payer: PPO | Admitting: Licensed Clinical Social Worker

## 2016-01-30 DEATH — deceased

## 2016-08-12 IMAGING — CR DG CHEST 1V PORT
1 series · 1 of 1 positions shown · non-contrast
Comparison: 05/02/2015

CLINICAL DATA: Shortness of breath and cough

EXAM:
PORTABLE CHEST 1 VIEW

[ap portable]
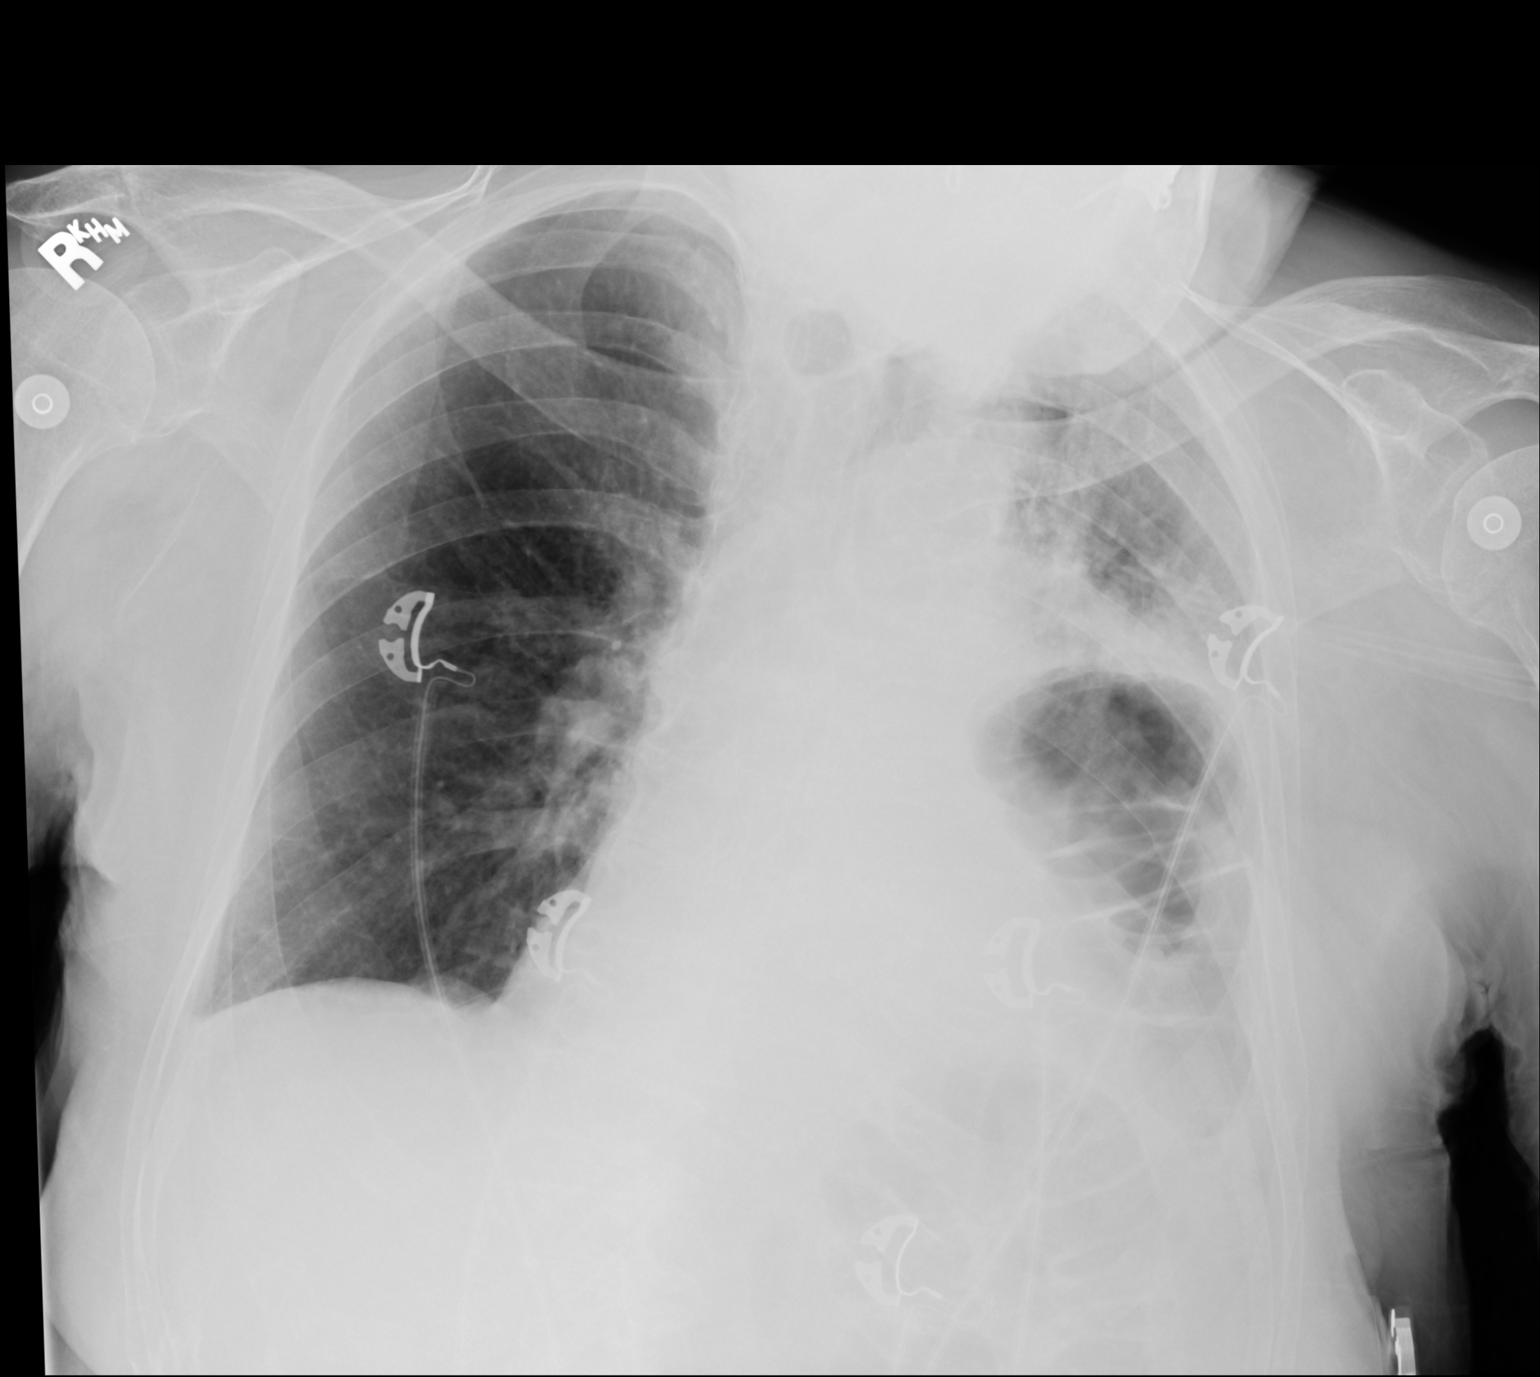

[1 of 1 positions shown; findings below may reference images not displayed]

FINDINGS: Cardiac shadow is stable. Elevation of left hemidiaphragm is again
seen. Left basilar infiltrate is noted. The right lung is clear. No
bony abnormality is seen.
IMPRESSION: Left basilar infiltrate.

## 2016-08-12 IMAGING — CR DG FOOT COMPLETE 3+V*R*
3 series · 3 of 3 positions shown · non-contrast
Comparison: None.

CLINICAL DATA: Right foot pain, no known injury, initial encounter

EXAM:
RIGHT FOOT COMPLETE - 3+ VIEW

[ap]
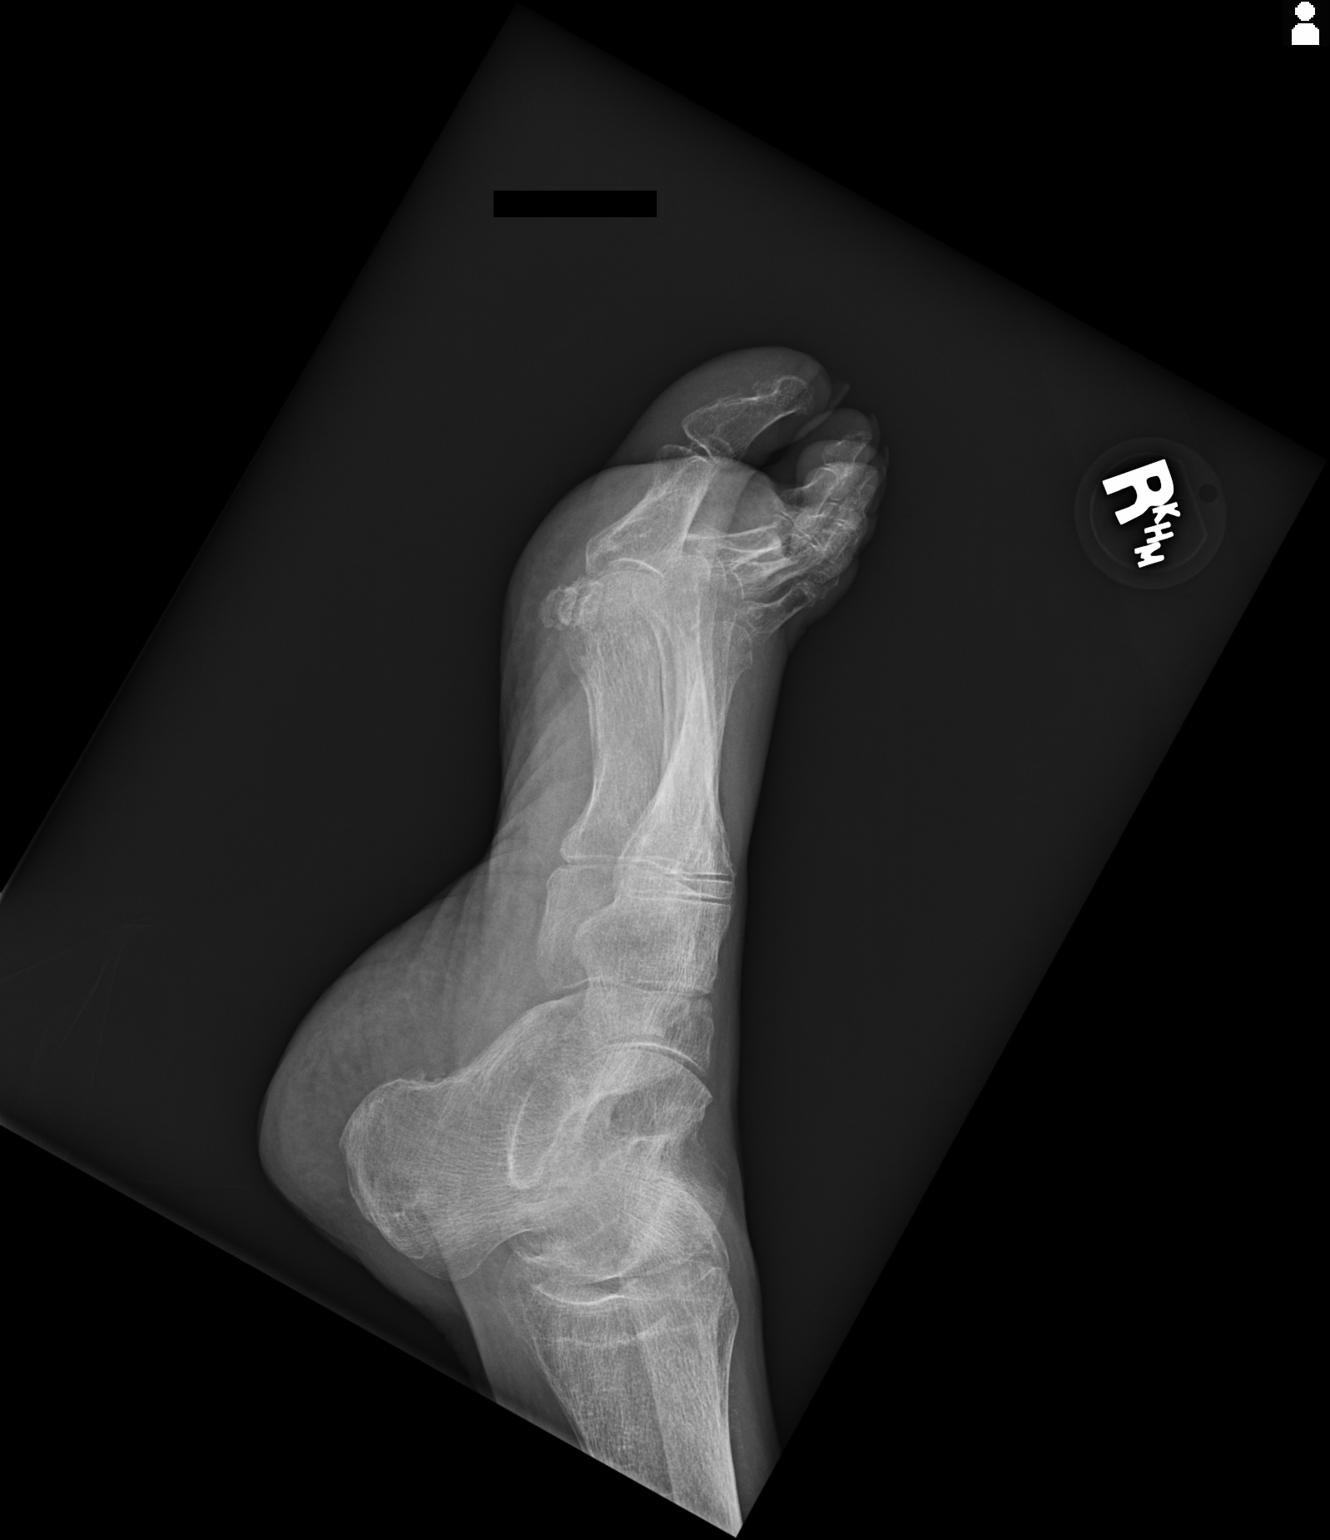

[oblique]
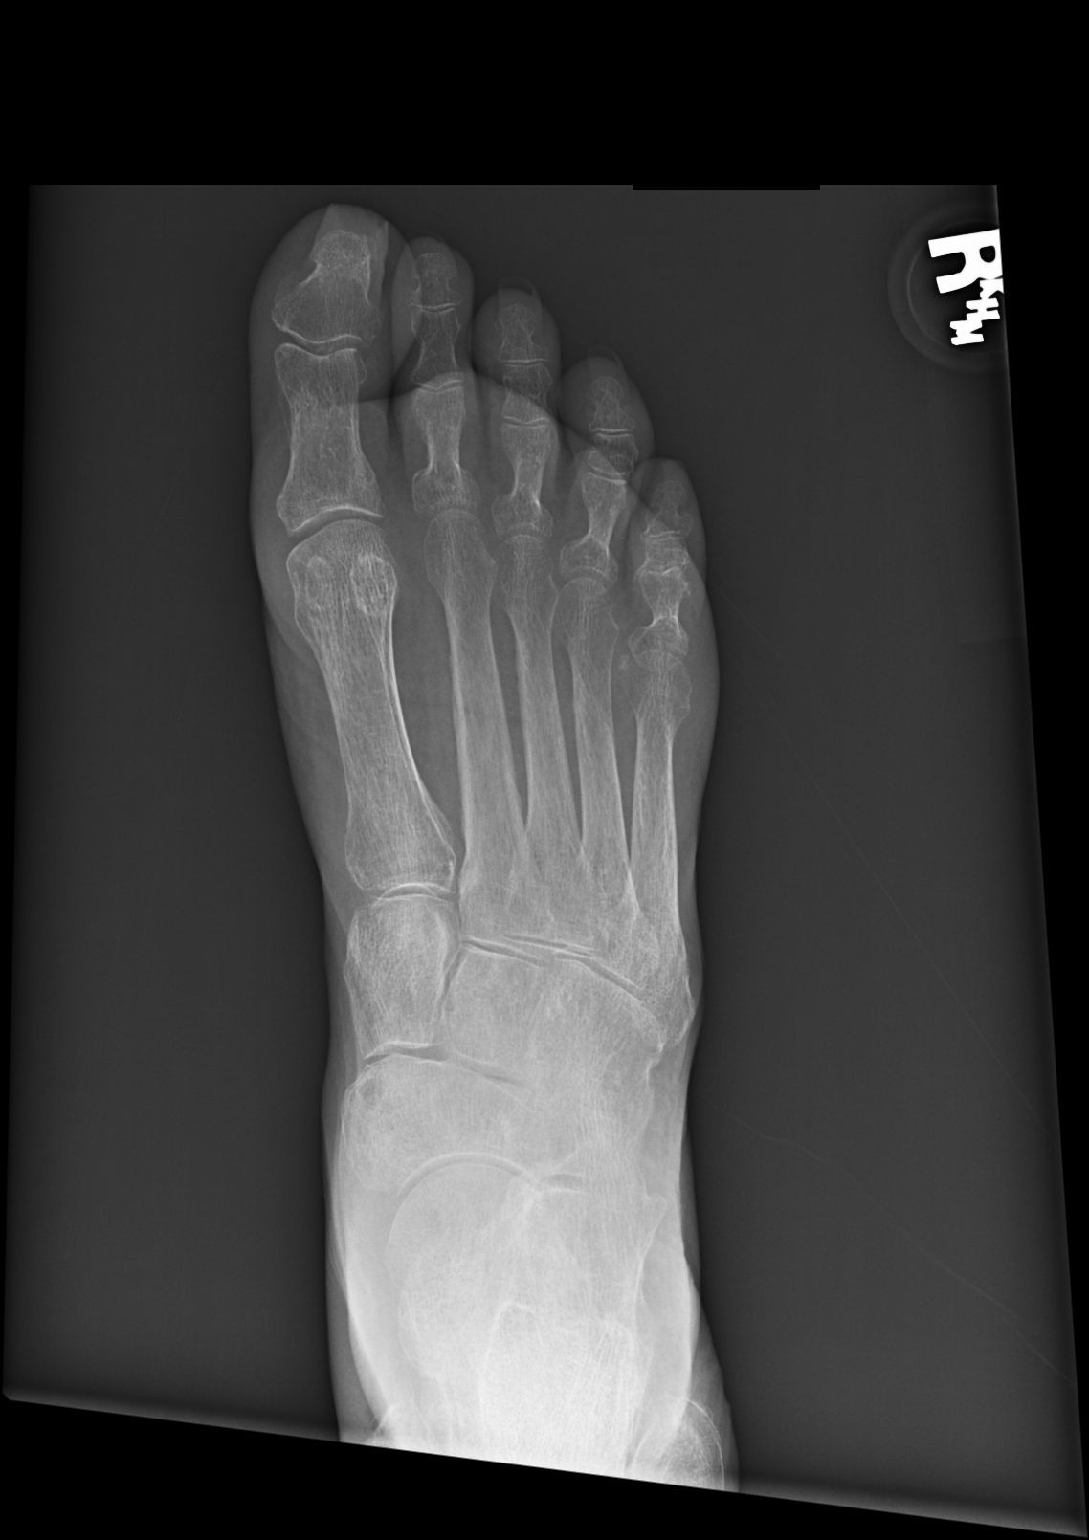

[lat]
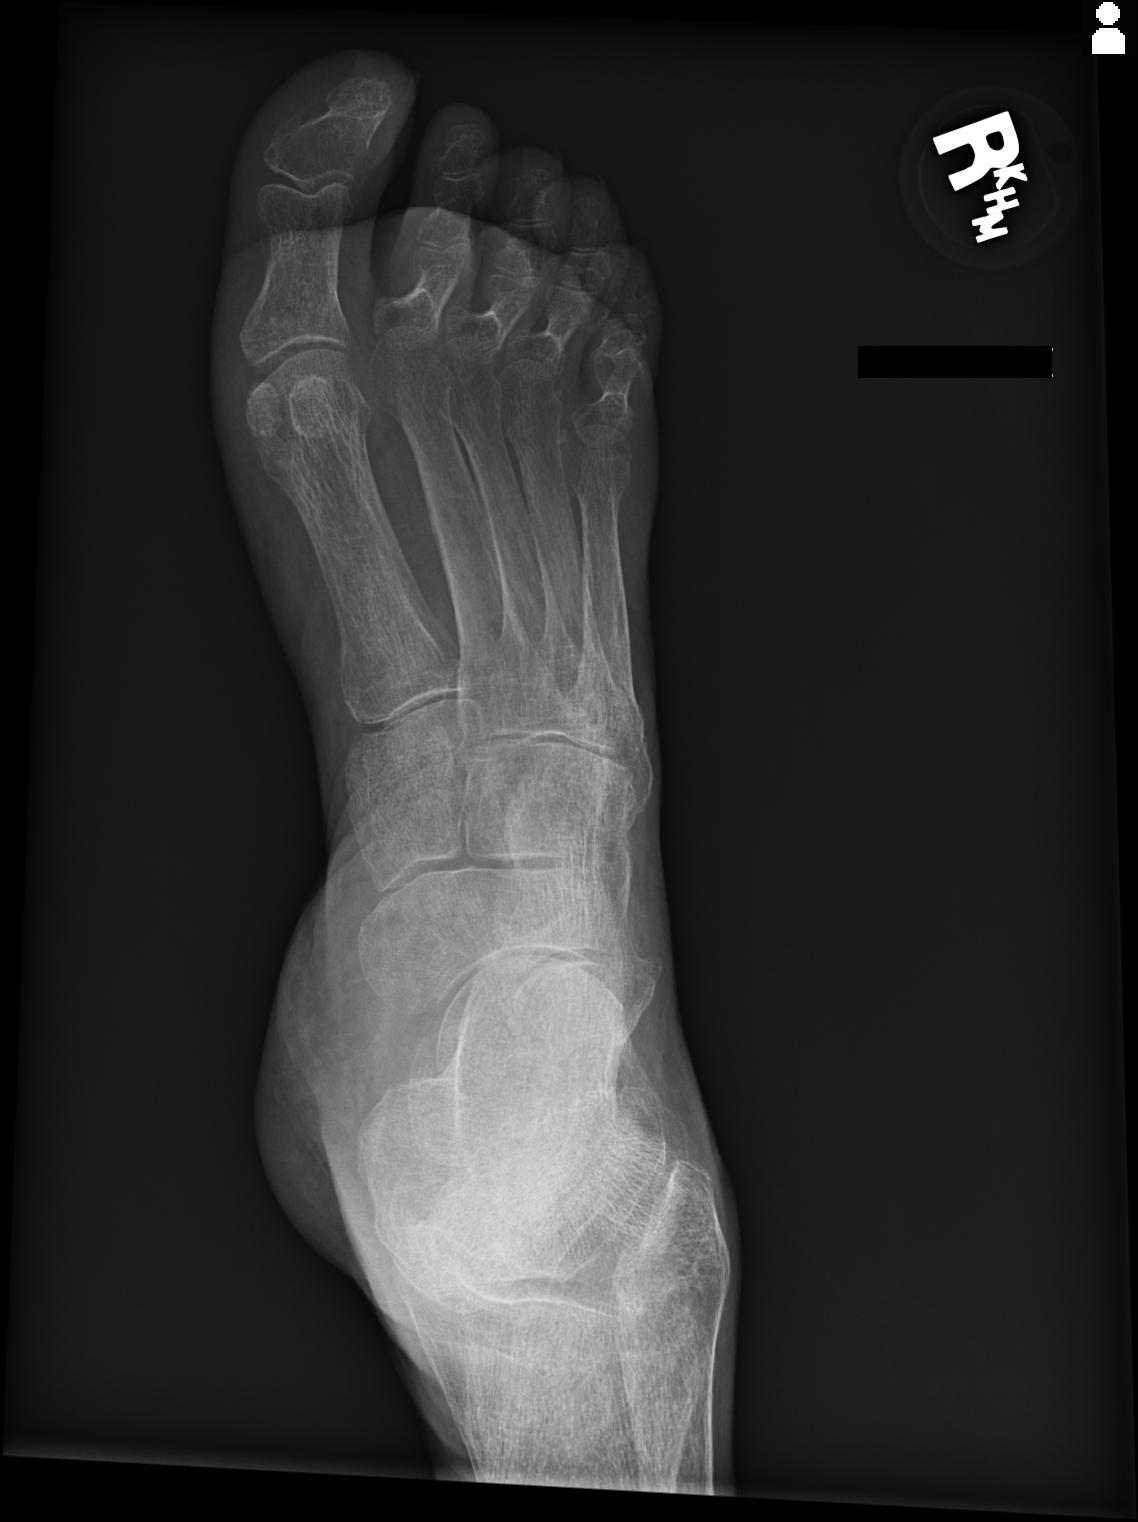

[3 of 3 positions shown; findings below may reference images not displayed]

FINDINGS: Generalized osteopenia is noted. No bony erosion is identified. No
acute fracture dislocation is seen.
IMPRESSION: No acute abnormality noted.
